# Patient Record
Sex: Female | Born: 1970 | Race: White | Hispanic: No | Marital: Single | Smoking: Current every day smoker
Health system: Southern US, Community
[De-identification: ages and names within clinical notes are randomized; demographics above are authoritative.]

## PROBLEM LIST (undated history)

## (undated) DIAGNOSIS — Z9889 Other specified postprocedural states: Secondary | ICD-10-CM

## (undated) DIAGNOSIS — F329 Major depressive disorder, single episode, unspecified: Secondary | ICD-10-CM

## (undated) DIAGNOSIS — F1120 Opioid dependence, uncomplicated: Secondary | ICD-10-CM

## (undated) DIAGNOSIS — G894 Chronic pain syndrome: Secondary | ICD-10-CM

## (undated) DIAGNOSIS — K589 Irritable bowel syndrome without diarrhea: Secondary | ICD-10-CM

## (undated) DIAGNOSIS — F32A Depression, unspecified: Secondary | ICD-10-CM

## (undated) DIAGNOSIS — K759 Inflammatory liver disease, unspecified: Secondary | ICD-10-CM

## (undated) DIAGNOSIS — K219 Gastro-esophageal reflux disease without esophagitis: Secondary | ICD-10-CM

## (undated) DIAGNOSIS — J45909 Unspecified asthma, uncomplicated: Secondary | ICD-10-CM

## (undated) DIAGNOSIS — F209 Schizophrenia, unspecified: Secondary | ICD-10-CM

## (undated) DIAGNOSIS — F1410 Cocaine abuse, uncomplicated: Secondary | ICD-10-CM

## (undated) DIAGNOSIS — K222 Esophageal obstruction: Secondary | ICD-10-CM

## (undated) DIAGNOSIS — F419 Anxiety disorder, unspecified: Secondary | ICD-10-CM

## (undated) HISTORY — DX: Gastro-esophageal reflux disease without esophagitis: K21.9

## (undated) HISTORY — DX: Depression, unspecified: F32.A

## (undated) HISTORY — DX: Schizophrenia, unspecified: F20.9

## (undated) HISTORY — PX: TOTAL ABDOMINAL HYSTERECTOMY: SHX209

## (undated) HISTORY — PX: JOINT REPLACEMENT: SHX530

## (undated) HISTORY — DX: Irritable bowel syndrome, unspecified: K58.9

## (undated) HISTORY — DX: Esophageal obstruction: K22.2

## (undated) HISTORY — DX: Anxiety disorder, unspecified: F41.9

## (undated) HISTORY — DX: Other specified postprocedural states: Z98.890

## (undated) HISTORY — DX: Major depressive disorder, single episode, unspecified: F32.9

---

## 2002-05-09 ENCOUNTER — Encounter: Payer: Self-pay | Admitting: *Deleted

## 2002-05-09 ENCOUNTER — Emergency Department (HOSPITAL_COMMUNITY): Admission: EM | Admit: 2002-05-09 | Discharge: 2002-05-09 | Payer: Self-pay | Admitting: *Deleted

## 2002-05-14 ENCOUNTER — Emergency Department (HOSPITAL_COMMUNITY): Admission: EM | Admit: 2002-05-14 | Discharge: 2002-05-14 | Payer: Self-pay | Admitting: *Deleted

## 2002-07-20 ENCOUNTER — Emergency Department (HOSPITAL_COMMUNITY): Admission: EM | Admit: 2002-07-20 | Discharge: 2002-07-20 | Payer: Self-pay | Admitting: Emergency Medicine

## 2002-07-23 ENCOUNTER — Emergency Department (HOSPITAL_COMMUNITY): Admission: EM | Admit: 2002-07-23 | Discharge: 2002-07-23 | Payer: Self-pay | Admitting: Emergency Medicine

## 2002-07-27 ENCOUNTER — Emergency Department (HOSPITAL_COMMUNITY): Admission: EM | Admit: 2002-07-27 | Discharge: 2002-07-27 | Payer: Self-pay | Admitting: *Deleted

## 2002-07-30 ENCOUNTER — Inpatient Hospital Stay (HOSPITAL_COMMUNITY): Admission: EM | Admit: 2002-07-30 | Discharge: 2002-08-07 | Payer: Self-pay | Admitting: Psychiatry

## 2002-08-17 ENCOUNTER — Emergency Department (HOSPITAL_COMMUNITY): Admission: EM | Admit: 2002-08-17 | Discharge: 2002-08-17 | Payer: Self-pay | Admitting: Emergency Medicine

## 2003-01-28 ENCOUNTER — Inpatient Hospital Stay (HOSPITAL_COMMUNITY): Admission: AD | Admit: 2003-01-28 | Discharge: 2003-01-30 | Payer: Self-pay | Admitting: Cardiology

## 2003-01-29 ENCOUNTER — Encounter: Payer: Self-pay | Admitting: Cardiology

## 2003-09-06 ENCOUNTER — Emergency Department (HOSPITAL_COMMUNITY): Admission: EM | Admit: 2003-09-06 | Discharge: 2003-09-06 | Payer: Self-pay | Admitting: Emergency Medicine

## 2003-09-06 ENCOUNTER — Encounter: Payer: Self-pay | Admitting: Emergency Medicine

## 2004-01-07 ENCOUNTER — Emergency Department (HOSPITAL_COMMUNITY): Admission: EM | Admit: 2004-01-07 | Discharge: 2004-01-07 | Payer: Self-pay | Admitting: Emergency Medicine

## 2005-04-05 ENCOUNTER — Emergency Department (HOSPITAL_COMMUNITY): Admission: EM | Admit: 2005-04-05 | Discharge: 2005-04-06 | Payer: Self-pay | Admitting: Emergency Medicine

## 2007-04-19 ENCOUNTER — Ambulatory Visit: Payer: Self-pay | Admitting: Cardiology

## 2007-04-24 ENCOUNTER — Ambulatory Visit: Payer: Self-pay | Admitting: Cardiology

## 2007-04-24 ENCOUNTER — Inpatient Hospital Stay (HOSPITAL_BASED_OUTPATIENT_CLINIC_OR_DEPARTMENT_OTHER): Admission: RE | Admit: 2007-04-24 | Discharge: 2007-04-24 | Payer: Self-pay | Admitting: Internal Medicine

## 2008-03-21 ENCOUNTER — Emergency Department (HOSPITAL_COMMUNITY): Admission: EM | Admit: 2008-03-21 | Discharge: 2008-03-21 | Payer: Self-pay | Admitting: Emergency Medicine

## 2010-01-31 ENCOUNTER — Emergency Department (HOSPITAL_COMMUNITY): Admission: EM | Admit: 2010-01-31 | Discharge: 2010-01-31 | Payer: Self-pay | Admitting: Emergency Medicine

## 2010-02-15 ENCOUNTER — Ambulatory Visit: Payer: Self-pay | Admitting: Internal Medicine

## 2010-02-15 DIAGNOSIS — F319 Bipolar disorder, unspecified: Secondary | ICD-10-CM

## 2010-02-15 DIAGNOSIS — F209 Schizophrenia, unspecified: Secondary | ICD-10-CM | POA: Insufficient documentation

## 2010-02-15 DIAGNOSIS — F341 Dysthymic disorder: Secondary | ICD-10-CM | POA: Insufficient documentation

## 2010-02-15 DIAGNOSIS — G43909 Migraine, unspecified, not intractable, without status migrainosus: Secondary | ICD-10-CM | POA: Insufficient documentation

## 2010-02-17 ENCOUNTER — Encounter: Payer: Self-pay | Admitting: Internal Medicine

## 2010-02-18 DIAGNOSIS — Z9889 Other specified postprocedural states: Secondary | ICD-10-CM

## 2010-02-18 HISTORY — DX: Other specified postprocedural states: Z98.890

## 2010-02-21 DIAGNOSIS — K219 Gastro-esophageal reflux disease without esophagitis: Secondary | ICD-10-CM

## 2010-02-21 DIAGNOSIS — R1310 Dysphagia, unspecified: Secondary | ICD-10-CM | POA: Insufficient documentation

## 2010-02-21 DIAGNOSIS — K921 Melena: Secondary | ICD-10-CM | POA: Insufficient documentation

## 2010-02-21 DIAGNOSIS — R197 Diarrhea, unspecified: Secondary | ICD-10-CM | POA: Insufficient documentation

## 2010-03-01 ENCOUNTER — Encounter: Payer: Self-pay | Admitting: Internal Medicine

## 2010-03-01 ENCOUNTER — Encounter (INDEPENDENT_AMBULATORY_CARE_PROVIDER_SITE_OTHER): Payer: Self-pay | Admitting: *Deleted

## 2010-03-03 ENCOUNTER — Ambulatory Visit (HOSPITAL_COMMUNITY): Admission: RE | Admit: 2010-03-03 | Discharge: 2010-03-03 | Payer: Self-pay | Admitting: Internal Medicine

## 2010-03-03 ENCOUNTER — Ambulatory Visit: Payer: Self-pay | Admitting: Internal Medicine

## 2010-03-06 ENCOUNTER — Encounter: Payer: Self-pay | Admitting: Internal Medicine

## 2010-03-16 ENCOUNTER — Telehealth (INDEPENDENT_AMBULATORY_CARE_PROVIDER_SITE_OTHER): Payer: Self-pay

## 2010-03-17 ENCOUNTER — Encounter: Payer: Self-pay | Admitting: Gastroenterology

## 2010-03-22 ENCOUNTER — Telehealth (INDEPENDENT_AMBULATORY_CARE_PROVIDER_SITE_OTHER): Payer: Self-pay

## 2010-03-28 ENCOUNTER — Ambulatory Visit: Payer: Self-pay | Admitting: Internal Medicine

## 2010-03-28 ENCOUNTER — Ambulatory Visit (HOSPITAL_COMMUNITY): Admission: RE | Admit: 2010-03-28 | Discharge: 2010-03-28 | Payer: Self-pay | Admitting: Internal Medicine

## 2010-03-28 DIAGNOSIS — R109 Unspecified abdominal pain: Secondary | ICD-10-CM | POA: Insufficient documentation

## 2010-03-28 DIAGNOSIS — K589 Irritable bowel syndrome without diarrhea: Secondary | ICD-10-CM

## 2010-04-13 ENCOUNTER — Encounter: Payer: Self-pay | Admitting: Internal Medicine

## 2010-04-27 ENCOUNTER — Encounter: Payer: Self-pay | Admitting: Gastroenterology

## 2010-05-16 ENCOUNTER — Emergency Department (HOSPITAL_COMMUNITY): Admission: EM | Admit: 2010-05-16 | Discharge: 2010-05-16 | Payer: Self-pay | Admitting: Emergency Medicine

## 2010-05-25 ENCOUNTER — Ambulatory Visit (HOSPITAL_COMMUNITY): Admission: RE | Admit: 2010-05-25 | Discharge: 2010-05-25 | Payer: Self-pay | Admitting: General Surgery

## 2010-06-19 ENCOUNTER — Ambulatory Visit: Payer: Self-pay | Admitting: Cardiology

## 2010-07-14 ENCOUNTER — Emergency Department (HOSPITAL_COMMUNITY): Admission: EM | Admit: 2010-07-14 | Discharge: 2010-07-14 | Payer: Self-pay | Admitting: Emergency Medicine

## 2010-08-10 ENCOUNTER — Telehealth (INDEPENDENT_AMBULATORY_CARE_PROVIDER_SITE_OTHER): Payer: Self-pay

## 2010-09-13 ENCOUNTER — Emergency Department (HOSPITAL_COMMUNITY): Admission: EM | Admit: 2010-09-13 | Discharge: 2010-09-13 | Payer: Self-pay | Admitting: Emergency Medicine

## 2010-11-01 ENCOUNTER — Emergency Department (HOSPITAL_COMMUNITY)
Admission: EM | Admit: 2010-11-01 | Discharge: 2010-11-01 | Payer: Self-pay | Source: Home / Self Care | Admitting: Emergency Medicine

## 2010-11-15 ENCOUNTER — Emergency Department (HOSPITAL_COMMUNITY)
Admission: EM | Admit: 2010-11-15 | Discharge: 2010-11-15 | Payer: Self-pay | Source: Home / Self Care | Admitting: Emergency Medicine

## 2010-11-22 ENCOUNTER — Encounter: Payer: Self-pay | Admitting: Internal Medicine

## 2010-11-24 ENCOUNTER — Emergency Department (HOSPITAL_COMMUNITY)
Admission: EM | Admit: 2010-11-24 | Discharge: 2010-11-25 | Payer: Self-pay | Source: Home / Self Care | Admitting: Emergency Medicine

## 2010-11-25 LAB — POCT I-STAT, CHEM 8
BUN: 8 mg/dL (ref 6–23)
Calcium, Ion: 1.11 mmol/L — ABNORMAL LOW (ref 1.12–1.32)
Chloride: 108 mEq/L (ref 96–112)
Creatinine, Ser: 0.9 mg/dL (ref 0.4–1.2)
Glucose, Bld: 83 mg/dL (ref 70–99)
HCT: 41 % (ref 36.0–46.0)
Hemoglobin: 13.9 g/dL (ref 12.0–15.0)
Potassium: 3.5 mEq/L (ref 3.5–5.1)
Sodium: 140 mEq/L (ref 135–145)
TCO2: 23 mmol/L (ref 0–100)

## 2010-11-29 ENCOUNTER — Encounter: Payer: Self-pay | Admitting: Internal Medicine

## 2010-11-29 ENCOUNTER — Ambulatory Visit
Admission: RE | Admit: 2010-11-29 | Discharge: 2010-11-29 | Payer: Self-pay | Source: Home / Self Care | Attending: Gastroenterology | Admitting: Gastroenterology

## 2010-11-30 ENCOUNTER — Encounter: Payer: Self-pay | Admitting: Internal Medicine

## 2010-12-05 ENCOUNTER — Ambulatory Visit (HOSPITAL_COMMUNITY)
Admission: RE | Admit: 2010-12-05 | Discharge: 2010-12-05 | Payer: Self-pay | Source: Home / Self Care | Attending: Internal Medicine | Admitting: Internal Medicine

## 2010-12-20 NOTE — Letter (Signed)
Summary: Patient Notice, Colon Biopsy Results  [facility]  [address]   [place], [place] [address]   Phone: [phone]  Fax: [phone]       March 06, 2010   ALLAN BACIGALUPI [address] [place] [address] [place], [place]  [address] 1971/05/27    Dear Ms. Parco,  I am pleased to inform you that the biopsies taken during your recent colonoscopy did not show any evidence of cancer upon pathologic examination.  Additional information/recommendations:  Continue with the treatment plan as outlined on the day of your exam.  You should have a repeat colonoscopy examination  in 10 years.  Please call us if you are having persistent problems or have questions about your condition that have not been fully answered at this time.  Sincerely,    R. Roetta Sessions MD, FACP [facility] Ph: [phone]    Fax: [phone]   Appended Document: Patient Notice, Colon Biopsy Results Letter mailed to pt.  Appended Document: Patient Notice, Colon Biopsy Results reminder in computer

## 2010-12-20 NOTE — Letter (Signed)
Summary: RECORDS FROM [facility]  RECORDS FROM [facility]   Imported By: Diana Eves 02/17/2010 14:07:28  _____________________________________________________________________  External Attachment:    Type:   Image     Comment:   External Document

## 2010-12-20 NOTE — Letter (Signed)
Summary: CT SCAN ORDER  CT SCAN ORDER   Imported By: Ave Filter 03/28/2010 12:31:24  _____________________________________________________________________  External Attachment:    Type:   Image     Comment:   External Document

## 2010-12-20 NOTE — Letter (Signed)
Summary: TCS/EGD POSS ED ORDER  TCS/EGD POSS ED ORDER   Imported By: Ave Filter 02/15/2010 14:52:33  _____________________________________________________________________  External Attachment:    Type:   Image     Comment:   External Document

## 2010-12-20 NOTE — Letter (Signed)
Summary: Out of Work Note  [facility]  [address]   [place], [place] [address]   Phone: [phone]  Fax: [phone]    03/01/2010  TO: Leodis Sias IT MAY CONCERN  RE: [name] [address],[id number] 11/19/1971       The above named individual is currently under my care and will be required to have a operatory procedure performed on Thursday April 14th, 2011.  She will be under sedation and require recovery time for the remainder of the day.    FROM: 03/03/2010   THROUGH: 03/03/2010    REASON: Multi-gastrointestinal procedure performed under anesthesia.    MAY RETURN ON: March 04, 2010     If you have any further questions or need additional information, please call.     Sincerely,     R. Roetta Sessions, MD    [facility] Phone: [phone]    Fax: [phone]

## 2010-12-20 NOTE — Medication Information (Signed)
Summary: oxycodone rx  oxycodone rx   Imported By: Hendricks Limes LPN 29/56/2130 86:57:84  _____________________________________________________________________  External Attachment:    Type:   Image     Comment:   External Document

## 2010-12-20 NOTE — Progress Notes (Signed)
Summary: pain rx request  Phone Note Call from Patient Call back at Home Phone [phone]   Caller: Patient Summary of Call: pt called- left voicemail- she has appt for ov with KJ on Monday. Wants to know if she can have something for pain to last untill Monday. pt uses [facility]. please advise. Initial call taken by: Hendricks Limes LPN,  August 10, 2010 3:17 PM     Appended Document: pain rx request can do clindium 2.5 mg (disp#40) - sig one by mouth before meals and at bedtime / no refills  Appended Document: pain rx request Pt informed. Rx called to [facility] at [place].

## 2010-12-20 NOTE — Assessment & Plan Note (Signed)
Summary: fu w/extender in next week per RMR/ss   Primary Care Provider:  None  Chief Complaint:  abd pain.  History of Present Illness: Here for work-in post-procedures, still w/ abd pain.  c/o mid-abd pain, feels like "cramping" pain, quivering, constant, 10/10 at worst, stayed in bed 2-3 days last week.  Librax helping w/ diarrhea/IBS, but not pain.  BM several per day, no bleeding.  Denies vomiting or fever.  Percocet helps best per pt, but has not had any since 2 weeks ago.  Has been on percocet previously for migraines and chronic left knee pain.  Denies any hx narcotic dependance.  Uses [facility] in [place].  4/12 H/H normal, CMP normal.  Recent EGD/colonoscopy benign without explanation of pain.   Current Problems (verified): 1)  Diarrhea  (ICD-787.91) 2)  Dysphagia Unspecified  (ICD-787.20) 3)  Gerd  (ICD-530.81) 4)  Hematochezia  (ICD-578.1) 5)  Schizophrenia  (ICD-295.90) 6)  Migraine Headache  (ICD-346.90) 7)  Depression/anxiety  (ICD-300.4) 8)  Bipolar Disorder Unspecified  (ICD-296.80)  Current Medications (verified): 1)  Lithium Carbonate 150 Mg Caps (Lithium Carbonate) .... Once Daily 2)  Seroquel 300 Mg Tabs (Quetiapine Fumarate) .... 3 At Bedtime 3)  Alprazolam 0.5 Mg Tabs (Alprazolam) .... Once Daily 4)  Oxycodone-Acetaminophen 5-325 Mg Tabs (Oxycodone-Acetaminophen) .Marland Kitchen.. 1 Po Q 4-6 Hours As Needed Abdominal Pain 5)  Ritalin 10 Mg Tabs (Methylphenidate Hcl) .... Once Daily 6)  Clidinium-Chlordiazepoxide 2.5-5 Mg Caps (Clidinium-Chlordiazepoxide) .... One By Mouth Qach and At Bedtime  Allergies (verified): 1)  ! Darvocet 2)  ! Ibuprofen  Review of Systems      See HPI General:  Complains of fatigue; denies fever, chills, sweats, anorexia, weakness, malaise, weight loss, and sleep disorder. CV:  Denies chest pains, angina, palpitations, syncope, dyspnea on exertion, orthopnea, PND, peripheral edema, and claudication. Resp:  Denies dyspnea at rest, dyspnea with  exercise, cough, sputum, wheezing, coughing up blood, and pleurisy. GI:  See HPI. GU:  Denies urinary burning, blood in urine, nocturnal urination, urinary frequency, and urinary incontinence. Derm:  Denies rash, itching, dry skin, hives, moles, warts, and unhealing ulcers.  Vital Signs:  Patient profile:   40 year old female Height:      66 inches Weight:      202 pounds BMI:     32.72 Temp:     98.1 degrees F oral Pulse rate:   84 / minute BP sitting:   110 / 84  (left arm) Cuff size:   large  Vitals Entered By: Hendricks Limes LPN (Mar 29, 2951 11:25 AM)  Physical Exam  General:  Well developed, well nourished, no acute distress. Head:  Normocephalic and atraumatic. Eyes:  Sclera clear  no icterus. Ears:  Normal auditory acuity. Mouth:  No deformity or lesions, dentition normal. Neck:  Supple; no masses or thyromegaly. Heart:  Regular rate and rhythm; no murmurs, rubs,  or bruits. Abdomen:  normal bowel sounds, obese, LLQ tenderness, epigastric tenderness, with guarding, and with rebound.   Msk:  Symmetrical with no gross deformities. Normal posture. Pulses:  Normal pulses noted. Extremities:  No clubbing, cyanosis, edema or deformities noted.  Multiple tattoos Neurologic:  Alert and  oriented x4;  grossly normal neurologically. Skin:  Intact without significant lesions or rashes. Cervical Nodes:  No significant cervical adenopathy. Psych:  Alert and cooperative. Normal mood and affect.  Impression & Recommendations:  Problem # 1:  ABDOMINAL PAIN (ICD-789.00) 40 y/o caucasian female w/ chronic severe abd pain, not relieved  by defecation and inconsistent w/ objective findings.  I suspect she has IBS, but ordered CT abd/pelvis to be sure we were not missing any acute abd pathology, including AAA, pancreatitis, appendicitis.  CT reviewed and Nothing on CT to explain pain at this point.  Orders: Est. Patient Level III (66063)  Problem # 2:  IBS (ICD-564.1) See #1  Problem #  3:  GERD (ICD-530.81) Chronic improved w/ PPI  Patient Instructions: 1)  She will not need narcotics in the future as treament for IBS. 2)  Suggest supportive measures, including fiber, probiotics, etc. 3)  Fu w/ PCP 4)  She should be on two times a day protonix 40mg  for acid reflux. Prescriptions: OXYCODONE-ACETAMINOPHEN 5-325 MG TABS (OXYCODONE-ACETAMINOPHEN) 1 po q 4-6 hours as needed abdominal pain  #20 x 0   Entered and Authorized by:   Joselyn Arrow FNP-BC   Signed by:   Joselyn Arrow FNP-BC on 03/28/2010   Method used:   Print then Give to Patient   RxID:   [id number]

## 2010-12-20 NOTE — Medication Information (Signed)
Summary: Tax adviser   Imported By: Diana Eves 03/17/2010 09:10:42  _____________________________________________________________________  External Attachment:    Type:   Image     Comment:   External Document  Appended Document: RX Folder-generic librax    Prescriptions: CLIDINIUM-CHLORDIAZEPOXIDE 2.5-5 MG CAPS (CLIDINIUM-CHLORDIAZEPOXIDE) one by mouth qach and at bedtime  #60 x 0   Entered and Authorized by:   Leanna Battles. Dixon Boos   Signed by:   Leanna Battles Wesson Stith PA-C on 03/17/2010   Method used:   Electronically to        [facility] (retail)       [address]       [place], [place]  [address]       Ph: [phone]       Fax: [phone]   RxID:   [id number]

## 2010-12-20 NOTE — Letter (Signed)
Summary: External Other  External Other   Imported By: Peggyann Shoals 04/13/2010 09:50:48  _____________________________________________________________________  External Attachment:    Type:   Image     Comment:   External Document

## 2010-12-20 NOTE — Progress Notes (Signed)
Summary: diarrhea and pain  Phone Note Call from Patient Call back at Home Phone [phone]   Caller: Patient Summary of Call: pt called- left voicemail- wants to know if RMR would call in something for diarrhea and pain to [facility]. please advise Initial call taken by: Hendricks Limes LPN,  March 16, 2010 2:59 PM     Appended Document: diarrhea and pain lets try librax one tab q ac an at bedtime (limit 4 daily);  disp 60 w 3 refills - i want to avoid narcotics.  needs ov w extender wn next 2- 3months  Appended Document: diarrhea and pain pt aware, rx called [facility]

## 2010-12-20 NOTE — Medication Information (Signed)
Summary: Tax adviser   Imported By: Diana Eves 04/27/2010 10:17:24  _____________________________________________________________________  External Attachment:    Type:   Image     Comment:   External Document  Appended Document: RX Folder RX and 3 refills given on 04/13/10  Appended Document: RX Folder Informed Molly Maduro, pharmacist, @ [facility].

## 2010-12-20 NOTE — Assessment & Plan Note (Signed)
Summary: RECTAL BLEED/SS   Visit Type:  Initial Visit Primary Care Provider:  none  Chief Complaint:  rectal bleeding x 1 month and diarrhea.  History of Present Illness: Pleasant 40 year old lady referred by Dr. Effie Shy in the  ED  to further evaluate a six-month history of bloody diarrhea and abdominal cramps. Symptoms developed insidiously some 6 months ago. Her premorbid bowel status amounted  to one to 2 bowel movements today without any blood per rectum She's never had any long-term diarrhea previously. She has multiple liquid stools daily, sometimes bloody. daily and she reports no nocturnal diarrhea. She has diffuse intermittent abdominal cramps. No recent antibiotic therapy.  She denies nonsteroidal agents. Family history significant in that her mother was diagnosed with colon cancer in her early 91s. This nice lady has never had  her lower GI tract evaluated. She has also been to multiple emergency departments  recently. She has not had a CAT scan. From January 23, 2010 her chem 20 was completely normal as was her CBC;  she had a urinalysis which was negative except for elevated specific gravity 1.030 -  abdominal series on March 16 at 11 demonstrated no acute abnormalities  She also describes heartburn daily for many, many years.  She also describes intermittent esophageal dysphagia to solids. She's never been on any prescribed medications for this problem as well. She does smoke;  she does not consume alcohol or use illicit drugs.    Preventive Screening-Counseling & Management  Alcohol-Tobacco     Smoking Status: current  Current Medications (verified): 1)  Lithium Carbonate 150 Mg Caps (Lithium Carbonate) .... Once Daily 2)  Seroquel 300 Mg Tabs (Quetiapine Fumarate) .... Three Times A Day 3)  Alprazolam 0.5 Mg Tabs (Alprazolam) .... Once Daily 4)  Oxycodone-Acetaminophen 5-325 Mg Tabs (Oxycodone-Acetaminophen) .... Q 4-6 Hours As Needed 5)  Ritalin 10 Mg Tabs (Methylphenidate Hcl)  .... Once Daily  Allergies (verified): 1)  ! Darvocet 2)  ! Ibuprofen  Past History:  Family History: Last updated: 2010/03/04 Father: deceased- lung cancer Mother: alive- colon cancer- [name] Siblings: 4 brothers, 1 sister deceased Family History of Colon Cancer: mother  Social History: Last updated: March 04, 2010 Marital Status: no Children: 2 Occupation: no Patient currently smokes.  Alcohol Use - no  Risk Factors: Smoking Status: current (03-04-2010)  Past Medical History: Anxiety Disorder  Past Surgical History: Hysterectomy  Family History: Father: deceased- lung cancer Mother: alive- colon cancer- [name] Siblings: 4 brothers, 1 sister deceased Family History of Colon Cancer: mother  Social History: Marital Status: no Children: 2 Occupation: no Patient currently smokes.  Alcohol Use - no Smoking Status:  current  Vital Signs:  Patient profile:   40 year old female Height:      66 inches Weight:      209 pounds BMI:     33.86 Temp:     98.2 degrees F oral Pulse rate:   72 / minute BP sitting:   108 / 76  (right arm) Cuff size:   regular  Vitals Entered By: Hendricks Limes LPN (Mar 04, 2010 2:03 PM)  Physical Exam  General:  a pleasant somewhat anxious lady resting comfortably in no acute distress Eyes:  no scleral icterus bruit conjunctiva are pink Lungs:  clear to auscultation Heart:  regular rate and rhythm without murmur gallop or Abdomen:  nondistended positive bowel sounds soft she has mild diffuse tenderness to palpation no appreciable mass or organomegaly Rectal:  deferred until time of  colonoscopy  Impression & Recommendations: Impression: A 40 year old lady with a 6 month history of bloody diarrhea and abdominal cramps. She has had lifelong symptoms of GERD and also describes esophageal dysphagia to solids. I agree with Dr. Effie Shy, she deserves further evaluation. By history she has frequent reflux symptoms. We need to  rule  out complicated GERD. She may have ring  or stricture or other process amenable to esophageal dilation. Further investigation warranted.  Etiology of rectal bleeding and diarrhea not clear at this point in time; she had a normal CBC earlier this month. Differential would include new-onset inflammatory bowel disease, infectious colitis less likely. Would be an  unusual presentation for occult colonic neoplasia. She does have a family history of colon cancer in a  first-degree relative.  Recommendations: EGD with esophageal dilation as appropriate as well as a diagnostic colonoscopy in the near future at the hospital.  Risks, benefits, limitations alternatives, imponderables have been reviewed. Her questions have been answered. Because of her polypharmacy, we will enlist the help of Dr. Jayme Cloud and associates with propofol sedation.  Further recommendations to follow.  Appended Document: Orders Update    Clinical Lists Changes  Problems: Added new problem of HEMATOCHEZIA (ICD-578.1) Added new problem of GERD (ICD-530.81) Added new problem of DYSPHAGIA UNSPECIFIED (ICD-787.20) Added new problem of DIARRHEA (ICD-787.91) Orders: Added new Service order of Consultation Level V [phone]) - Signed

## 2010-12-20 NOTE — Progress Notes (Signed)
Summary: phone note/ pt still having alot of abdominal pain  Phone Note Call from Patient   Caller: Patient Summary of Call: Pt called to say the Librax has helped some, her diarrhea is better but she is still having alot of abdominal pain. please advise. Initial call taken by: Cloria Spring LPN,  Mar 22, 6961 1:19 PM     Appended Document: phone note/ pt still having alot of abdominal pain sounds like she needs further evaluaton; lets ge her in wn the next week to see extender  Appended Document: phone note/ pt still having alot of abdominal pain Pt informed.  Appended Document: phone note/ pt still having alot of abdominal pain Disc w/ susan.  use urgent slot  Appended Document: phone note/ pt still having alot of abdominal pain LMOM that pt needed to be seen next week and her appt is with KJ on monday 5/9 at 1130.

## 2010-12-22 NOTE — Assessment & Plan Note (Signed)
Summary: ER F/U DIARRHEA,ABD CRAMPS,BRPR/LAW   Visit Type:  Follow-up Consult Referring Provider:  Greta Doom, PA: [facility] Primary Care Provider:  None  CC:  ER follow up- diarrhea, abd cramps, and brbpr.  History of Present Illness: Ms. Widener presents today in ED follow-up. Has hx of chronic abdominal pain, severe reflux with last EGD/TCS done April 2011. Hx of IBS. Last seen in May 2011, underwent CT which was normal, secondary to abdominal pain. Reports left-sided abdominal pain/mid abdominal pain, constant underlying ache, as well as intermittent episodes of sharp pain of 10/10, like labor pains. Not associated with stools, not associated with eating/drinking. The only thing that helps is pain medication. Lasts hours at a time. reports continued reflux, taking protonix twice/day. reports continued dysphagia, states has to "watch everything I eat or I get choked". rare nausea. unable to report # of stools per day, states "loses count". +postprandial diarrhea. liquid, watery, +brb on tissue, reports small amount of blood in toilet as well. 3-4X/week. No supplemental fiber, No probiotic. In ED, physical exam noted external hemorrhoids, not inflamed. guaic negative. Hgb 13.9, Hct 41  03/03/10: EGD/COLON IMPRESSION: 1. Circumferential distal esophageal erosions with a patulous EG     junction and Schatzki's ring consistent with moderately severe     erosive reflux esophagitis status post dilation with Maloney     dilator, small hiatal hernia otherwise normal stomach, D1 and D2. 2. Colonoscopy findings, single diminutive rectal polyp status post     cold biopsy removal, otherwise normal rectum. 3. Normal colon, normal terminal ileum.  Current Medications (verified): 1)  Lithium Carbonate 150 Mg Caps (Lithium Carbonate) .... Once Daily 2)  Seroquel 300 Mg Tabs (Quetiapine Fumarate) .... 3 At Bedtime 3)  Alprazolam 0.5 Mg Tabs (Alprazolam) .... Once Daily 4)  Oxycodone-Acetaminophen 5-325 Mg Tabs  (Oxycodone-Acetaminophen) .Marland Kitchen.. 1 Po Q 4-6 Hours As Needed Abdominal Pain 5)  Clidinium-Chlordiazepoxide 2.5-5 Mg Caps (Clidinium-Chlordiazepoxide) .... One By Mouth Qach and At Bedtime  Allergies (verified): 1)  ! Darvocet 2)  ! Ibuprofen  Past History:  Past Surgical History: Last updated: 03/01/2010 Hysterectomy  Family History: Last updated: March 01, 2010 Father: deceased- lung cancer Mother: alive- colon cancer- [name] Siblings: 4 brothers, 1 sister deceased Family History of Colon Cancer: mother  Social History: Last updated: 03-01-2010 Marital Status: no Children: 2 Occupation: no Patient currently smokes.  Alcohol Use - no  Past Medical History: Anxiety Disorder 03/03/10 EGD/Colon IMPRESSION: 1. Circumferential distal esophageal erosions with a patulous EG     junction and Schatzki's ring consistent with moderately severe     erosive reflux esophagitis status post dilation with Maloney     dilator, small hiatal hernia otherwise normal stomach, D1 and D2. 2. Colonoscopy findings, single diminutive rectal polyp status post     cold biopsy removal, otherwise normal rectum. 3. Normal colon, normal terminal ileum.  Review of Systems General:  Denies fever, chills, and anorexia. Eyes:  Denies blurring, irritation, and discharge. ENT:  Complains of difficulty swallowing; denies sore throat and hoarseness. CV:  Denies chest pains and syncope. Resp:  Denies dyspnea at rest and wheezing. GI:  Complains of difficulty swallowing, indigestion/heartburn, abdominal pain, and diarrhea; denies bloody BM's and black BMs. GU:  Denies urinary burning and urinary frequency. MS:  Denies joint pain / LOM, joint swelling, and joint stiffness. Derm:  Denies rash, itching, and dry skin. Neuro:  Denies weakness and syncope. Psych:  Denies depression and anxiety. Endo:  Denies cold intolerance and heat intolerance.  Vital Signs:  Patient profile:   40 year old female Height:       66 inches Weight:      184 pounds BMI:     29.81 Temp:     98.4 degrees F oral Pulse rate:   68 / minute BP sitting:   128 / 92  (left arm) Cuff size:   regular  Vitals Entered By: Hendricks Limes LPN (November 29, 2010 2:09 PM)  Physical Exam  General:  Well developed, well nourished, no acute distress. Head:  Normocephalic and atraumatic. Eyes:  sclera without icterus, conjuctiva clear Mouth:  No deformity or lesions, dentition normal. Lungs:  Clear throughout to auscultation. Heart:  Regular rate and rhythm; no murmurs, rubs,  or bruits. Abdomen:  +BS, non-distended, obese. No HSM. No rebound or guarding, mildly diffusely tender, +Carnett's Msk:  Symmetrical with no gross deformities. Normal posture. Neurologic:  Alert and  oriented x4;  grossly normal neurologically.  Impression & Recommendations:  Problem # 1:  ABDOMINAL PAIN (ICD-78.28)  40 year old female with chronic abdominal pain, hx of IBS. Multiple visits to ED in past. CT in May demonstrated no etiology to chronic pain, likely component of IBS. +Carnett's sign. Will add supplemental treatment to aid in symptoms of IBS, but would benefit from referral to pain management, as there may be an underlying component of functional abdominal pain. EGD/Colon in April 2011 reassurring.  Supplemental Fiber of choice to diet (samples given) Probiotic of choice daily (samples given) Referral to pain management Consider alternative to Librax in future  Orders: Est. Patient Level II (82956)  Problem # 2:  GERD (ICD-530.81)  hx of reflux with continued symptoms. also has +dysphagia, last EGd/ED done April 2011.   Switch to Dexilant, samples given UGI/BPE to assess continued dysphagia  Orders: Est. Patient Level II (21308)  Problem # 3:  HEMATOCHEZIA (ICD-578.1) Low-volume hematochezia likely secondary to external hemorrhoids. Will trial anusol suppositories. Last colonoscopy April 2011. No need for colonoscopy at this  point.  Anusol suppositories 25 mg per rectum two times a day X 10 days Call office if continues Prescriptions: ANUSOL-HC 25 MG SUPP (HYDROCORTISONE ACETATE) one per rectum two times a day X 10 days  #20 x 0   Entered and Authorized by:   Gerrit Halls NP   Signed by:   Gerrit Halls NP on 11/29/2010   Method used:   Faxed to ...       [facility] (retail)       [address]       [place], [place]  [address]       Ph: [phone]       Fax: [phone]   RxID:   [id number]

## 2010-12-22 NOTE — Letter (Signed)
Summary: UPPER GI/BPE ORDER  UPPER GI/BPE ORDER   Imported By: Rexene Alberts 11/29/2010 15:07:15  _____________________________________________________________________  External Attachment:    Type:   Image     Comment:   External Document

## 2010-12-22 NOTE — Letter (Signed)
Summary: DISABILITY DETERMINATION  DISABILITY DETERMINATION   Imported By: Rexene Alberts 11/22/2010 16:18:43  _____________________________________________________________________  External Attachment:    Type:   Image     Comment:   External Document

## 2010-12-22 NOTE — Letter (Addendum)
Summary: PAIN CLINIC REFERRAL  PAIN CLINIC REFERRAL   Imported By: Ave Filter 11/30/2010 08:36:41  _____________________________________________________________________  External Attachment:    Type:   Image     Comment:   External Document  Appended Document: PAIN CLINIC REFERRAL Pt rescheduled her appt with Dr Gerilyn Pilgrim and then NO SHOWED for the last appt.

## 2010-12-27 ENCOUNTER — Emergency Department (HOSPITAL_COMMUNITY)
Admission: EM | Admit: 2010-12-27 | Discharge: 2010-12-27 | Disposition: A | Discharge status: Home / Self Care | Payer: Self-pay | Attending: Emergency Medicine | Admitting: Emergency Medicine

## 2010-12-27 DIAGNOSIS — R10819 Abdominal tenderness, unspecified site: Secondary | ICD-10-CM | POA: Insufficient documentation

## 2010-12-27 DIAGNOSIS — K219 Gastro-esophageal reflux disease without esophagitis: Secondary | ICD-10-CM | POA: Insufficient documentation

## 2010-12-27 DIAGNOSIS — F341 Dysthymic disorder: Secondary | ICD-10-CM | POA: Insufficient documentation

## 2010-12-27 DIAGNOSIS — F319 Bipolar disorder, unspecified: Secondary | ICD-10-CM | POA: Insufficient documentation

## 2010-12-27 DIAGNOSIS — Z79899 Other long term (current) drug therapy: Secondary | ICD-10-CM | POA: Insufficient documentation

## 2010-12-27 DIAGNOSIS — R109 Unspecified abdominal pain: Secondary | ICD-10-CM | POA: Insufficient documentation

## 2010-12-27 DIAGNOSIS — G8929 Other chronic pain: Secondary | ICD-10-CM | POA: Insufficient documentation

## 2010-12-27 DIAGNOSIS — R630 Anorexia: Secondary | ICD-10-CM | POA: Insufficient documentation

## 2011-01-30 LAB — DIFFERENTIAL
Basophils Absolute: 0.1 10*3/uL (ref 0.0–0.1)
Basophils Absolute: 0.1 10*3/uL (ref 0.0–0.1)
Basophils Relative: 1 % (ref 0–1)
Basophils Relative: 1 % (ref 0–1)
Eosinophils Absolute: 0.6 10*3/uL (ref 0.0–0.7)
Eosinophils Absolute: 0.5 10*3/uL (ref 0.0–0.7)
Eosinophils Relative: 4 % (ref 0–5)
Eosinophils Relative: 5 % (ref 0–5)
Lymphocytes Relative: 37 % (ref 12–46)
Lymphocytes Relative: 51 % — ABNORMAL HIGH (ref 12–46)
Lymphs Abs: 5.3 10*3/uL — ABNORMAL HIGH (ref 0.7–4.0)
Lymphs Abs: 5.5 10*3/uL — ABNORMAL HIGH (ref 0.7–4.0)
Monocytes Absolute: 0.8 10*3/uL (ref 0.1–1.0)
Monocytes Absolute: 0.7 10*3/uL (ref 0.1–1.0)
Monocytes Relative: 6 % (ref 3–12)
Monocytes Relative: 6 % (ref 3–12)
Neutro Abs: 7.6 10*3/uL (ref 1.7–7.7)
Neutro Abs: 4 10*3/uL (ref 1.7–7.7)
Neutrophils Relative %: 53 % (ref 43–77)
Neutrophils Relative %: 37 % — ABNORMAL LOW (ref 43–77)

## 2011-01-30 LAB — POCT CARDIAC MARKERS
CKMB, poc: <1 ng/mL — ABNORMAL LOW (ref 1.0–8.0)
CKMB, poc: <1 ng/mL — ABNORMAL LOW (ref 1.0–8.0)
Myoglobin, poc: 32.6 ng/mL (ref 12–200)
Myoglobin, poc: 38.4 ng/mL (ref 12–200)
Troponin i, poc: <0.05 ng/mL (ref 0.00–0.09)
Troponin i, poc: <0.05 ng/mL (ref 0.00–0.09)

## 2011-01-30 LAB — COMPREHENSIVE METABOLIC PANEL
ALT: 23 U/L (ref 0–35)
AST: 21 U/L (ref 0–37)
Albumin: 3.9 g/dL (ref 3.5–5.2)
Alkaline Phosphatase: 72 U/L (ref 39–117)
BUN: 10 mg/dL (ref 6–23)
CO2: 25 mEq/L (ref 19–32)
Calcium: 9.1 mg/dL (ref 8.4–10.5)
Chloride: 105 mEq/L (ref 96–112)
Creatinine, Ser: 0.89 mg/dL (ref 0.4–1.2)
GFR calc Af Amer: >60 mL/min (ref >60)
GFR calc non Af Amer: >60 mL/min (ref >60)
Glucose, Bld: 91 mg/dL (ref 70–99)
Potassium: 3.9 mEq/L (ref 3.5–5.1)
Sodium: 136 mEq/L (ref 135–145)
Total Bilirubin: 0.3 mg/dL (ref 0.3–1.2)
Total Protein: 6.6 g/dL (ref 6.0–8.3)

## 2011-01-30 LAB — CBC
HCT: 36.5 % (ref 36.0–46.0)
HCT: 36.4 % (ref 36.0–46.0)
Hemoglobin: 12.5 g/dL (ref 12.0–15.0)
Hemoglobin: 13 g/dL (ref 12.0–15.0)
MCH: 31.6 pg (ref 26.0–34.0)
MCH: 32.7 pg (ref 26.0–34.0)
MCHC: 34.2 g/dL (ref 30.0–36.0)
MCHC: 35.7 g/dL (ref 30.0–36.0)
MCV: 92.4 fL (ref 78.0–100.0)
MCV: 91.7 fL (ref 78.0–100.0)
Platelets: 276 10*3/uL (ref 150–400)
Platelets: 294 10*3/uL (ref 150–400)
RBC: 3.95 MIL/uL (ref 3.87–5.11)
RBC: 3.97 MIL/uL (ref 3.87–5.11)
RDW: 13.2 % (ref 11.5–15.5)
RDW: 13.1 % (ref 11.5–15.5)
WBC: 14.5 10*3/uL — ABNORMAL HIGH (ref 4.0–10.5)
WBC: 10.8 10*3/uL — ABNORMAL HIGH (ref 4.0–10.5)

## 2011-01-30 LAB — BASIC METABOLIC PANEL
BUN: 14 mg/dL (ref 6–23)
CO2: 27 mEq/L (ref 19–32)
Calcium: 9.9 mg/dL (ref 8.4–10.5)
Chloride: 107 mEq/L (ref 96–112)
Creatinine, Ser: 1.07 mg/dL (ref 0.4–1.2)
GFR calc Af Amer: >60 mL/min (ref >60)
GFR calc non Af Amer: 57 mL/min — ABNORMAL LOW (ref >60)
Glucose, Bld: 87 mg/dL (ref 70–99)
Potassium: 3.8 mEq/L (ref 3.5–5.1)
Sodium: 141 mEq/L (ref 135–145)

## 2011-02-02 LAB — URINALYSIS, ROUTINE W REFLEX MICROSCOPIC
Bilirubin Urine: NEGATIVE
Glucose, UA: NEGATIVE mg/dL
Hgb urine dipstick: NEGATIVE
Ketones, ur: NEGATIVE mg/dL
Nitrite: NEGATIVE
Protein, ur: NEGATIVE mg/dL
Specific Gravity, Urine: 1.025 (ref 1.005–1.030)
Urobilinogen, UA: 0.2 mg/dL (ref 0.0–1.0)
pH: 6 (ref 5.0–8.0)

## 2011-02-08 LAB — BASIC METABOLIC PANEL
BUN: 15 mg/dL (ref 6–23)
CO2: 28 mEq/L (ref 19–32)
Calcium: 9.7 mg/dL (ref 8.4–10.5)
Chloride: 106 mEq/L (ref 96–112)
Creatinine, Ser: 0.76 mg/dL (ref 0.4–1.2)
GFR calc Af Amer: >60 mL/min (ref >60)
GFR calc non Af Amer: >60 mL/min (ref >60)
Glucose, Bld: 101 mg/dL — ABNORMAL HIGH (ref 70–99)
Potassium: 4.6 mEq/L (ref 3.5–5.1)
Sodium: 139 mEq/L (ref 135–145)

## 2011-02-08 LAB — HEMOGLOBIN AND HEMATOCRIT, BLOOD
HCT: 38.3 % (ref 36.0–46.0)
Hemoglobin: 13.3 g/dL (ref 12.0–15.0)

## 2011-02-12 LAB — BASIC METABOLIC PANEL
CO2: 26 mEq/L (ref 19–32)
Calcium: 9.5 mg/dL (ref 8.4–10.5)
Creatinine, Ser: 0.83 mg/dL (ref 0.4–1.2)
GFR calc non Af Amer: >60 mL/min (ref >60)
Glucose, Bld: 75 mg/dL (ref 70–99)
Sodium: 139 mEq/L (ref 135–145)

## 2011-02-12 LAB — URINALYSIS, ROUTINE W REFLEX MICROSCOPIC
Bilirubin Urine: NEGATIVE
Hgb urine dipstick: NEGATIVE
Nitrite: NEGATIVE
Protein, ur: NEGATIVE mg/dL
Specific Gravity, Urine: 1.015 (ref 1.005–1.030)
Urobilinogen, UA: 0.2 mg/dL (ref 0.0–1.0)

## 2011-02-12 LAB — CBC
Hemoglobin: 13.5 g/dL (ref 12.0–15.0)
MCHC: 34.6 g/dL (ref 30.0–36.0)
Platelets: 304 10*3/uL (ref 150–400)
RDW: 13.2 % (ref 11.5–15.5)

## 2011-02-12 LAB — DIFFERENTIAL
Basophils Absolute: 0 10*3/uL (ref 0.0–0.1)
Basophils Relative: 0 % (ref 0–1)
Lymphocytes Relative: 32 % (ref 12–46)
Monocytes Absolute: 0.1 10*3/uL (ref 0.1–1.0)
Neutro Abs: 6.9 10*3/uL (ref 1.7–7.7)
Neutrophils Relative %: 64 % (ref 43–77)

## 2011-02-22 ENCOUNTER — Emergency Department (HOSPITAL_COMMUNITY)
Admission: EM | Admit: 2011-02-22 | Discharge: 2011-02-23 | Disposition: A | Discharge status: Home / Self Care | Payer: Self-pay | Attending: Emergency Medicine | Admitting: Emergency Medicine

## 2011-02-22 DIAGNOSIS — K5090 Crohn's disease, unspecified, without complications: Secondary | ICD-10-CM | POA: Insufficient documentation

## 2011-02-22 DIAGNOSIS — K219 Gastro-esophageal reflux disease without esophagitis: Secondary | ICD-10-CM | POA: Insufficient documentation

## 2011-02-22 DIAGNOSIS — R109 Unspecified abdominal pain: Secondary | ICD-10-CM | POA: Insufficient documentation

## 2011-02-22 DIAGNOSIS — F319 Bipolar disorder, unspecified: Secondary | ICD-10-CM | POA: Insufficient documentation

## 2011-02-22 LAB — COMPREHENSIVE METABOLIC PANEL
ALT: 28 U/L (ref 0–35)
AST: 27 U/L (ref 0–37)
Albumin: 4 g/dL (ref 3.5–5.2)
Alkaline Phosphatase: 66 U/L (ref 39–117)
BUN: 15 mg/dL (ref 6–23)
Chloride: 106 mEq/L (ref 96–112)
GFR calc Af Amer: >60 mL/min (ref >60)
Potassium: 4.1 mEq/L (ref 3.5–5.1)
Sodium: 140 mEq/L (ref 135–145)
Total Bilirubin: 0.3 mg/dL (ref 0.3–1.2)
Total Protein: 6.8 g/dL (ref 6.0–8.3)

## 2011-02-22 LAB — CBC
Hemoglobin: 13.2 g/dL (ref 12.0–15.0)
MCH: 31.5 pg (ref 26.0–34.0)
MCHC: 32.8 g/dL (ref 30.0–36.0)
RDW: 13.8 % (ref 11.5–15.5)

## 2011-02-22 LAB — DIFFERENTIAL
Basophils Absolute: 0.1 10*3/uL (ref 0.0–0.1)
Basophils Relative: 1 % (ref 0–1)
Eosinophils Relative: 7 % — ABNORMAL HIGH (ref 0–5)
Monocytes Absolute: 0.9 10*3/uL (ref 0.1–1.0)
Monocytes Relative: 9 % (ref 3–12)
Neutro Abs: 4.7 10*3/uL (ref 1.7–7.7)

## 2011-02-23 LAB — URINALYSIS, ROUTINE W REFLEX MICROSCOPIC
Glucose, UA: NEGATIVE mg/dL
Hgb urine dipstick: NEGATIVE
Ketones, ur: NEGATIVE mg/dL
Protein, ur: NEGATIVE mg/dL
Urobilinogen, UA: 0.2 mg/dL (ref 0.0–1.0)

## 2011-03-06 ENCOUNTER — Encounter: Payer: Self-pay | Admitting: Gastroenterology

## 2011-03-06 ENCOUNTER — Ambulatory Visit (INDEPENDENT_AMBULATORY_CARE_PROVIDER_SITE_OTHER): Payer: Self-pay | Admitting: Gastroenterology

## 2011-03-06 DIAGNOSIS — K921 Melena: Secondary | ICD-10-CM

## 2011-03-06 DIAGNOSIS — R197 Diarrhea, unspecified: Secondary | ICD-10-CM

## 2011-03-06 DIAGNOSIS — R109 Unspecified abdominal pain: Secondary | ICD-10-CM

## 2011-03-06 MED ORDER — DICYCLOMINE HCL 10 MG PO CAPS: 10.0000 mg | ORAL_CAPSULE | Freq: Three times a day (TID) | ORAL | Status: AC

## 2011-03-06 NOTE — Patient Instructions (Signed)
Obtain stool samples and return to our office May take imodium as needed Avoid fatty foods Trial of Bentyl before meals and at bedtime We will make a referral for you to see a primary care physician Abdominal pain: will assess stool samples first then proceed with appropriate management

## 2011-03-09 ENCOUNTER — Encounter: Payer: Self-pay | Admitting: Gastroenterology

## 2011-03-09 NOTE — Assessment & Plan Note (Signed)
Chronic abdominal pain without etiology to explain: CT in past negative, labs WNL, recent EGD/colon in April 2011 reassuring. Has hx IBS, with increased loose stools most recently. Likely functional abdominal pain, with IBS exacerbating. Have already referred for pain management, but pt is unable to afford. Will trial Bentyl, check stool studies, referral for a PCP, as pt does not have one. Likely low-volume hematochezia secondary to known hemorrhoids, irritation from stool frequency.   Trial of Bentyl, rx given Continue fiber Imodium as needed Stool samples, doubt infectious etiology, but due to increased frequency will assess Further rec's to follow

## 2011-03-09 NOTE — Progress Notes (Signed)
Referring Provider: No ref. provider found Primary Care Physician:  No primary provider on file. Gastroenterologist: Dr. Jena Gauss  Chief Complaint  Patient presents with  . Diarrhea    times 1-2 years  . GI Bleeding    when wiping it will sometimes be all blood    HPI:   [name] presents today in f/u for chronic abdominal pain, IBS, hemorrhoids. In May 2011, CT abd performed for abdominal pain which was normal. EGD/colon in April 2011: see PMH for full details. Continues to c/o lower abdominal pain, constant, feels like labor pains. BM doesn't relieve. 7-8 stools per day. Sometimes paper hematochezia. Taking fiber, no probiotic. States everything she eats and drinks goes right through her. No sick contacts, no recent hospitalization or abx.  States only thing that helps is pain medication. Was scheduled for pain management, but then rescheduled, then no-showed. States she can't afford. Doesn't have PCP.   February 22, 2011: H/H 13.2/40.3. CMP all nl.  Has had multiple ED visits. Most recent visit documents "Crohn's flares". Unsure why, as pt does not have Crohn's. Pt denies she told this to the ED.   Past Medical History  Diagnosis Date  . GERD (gastroesophageal reflux disease)   . IBS (irritable bowel syndrome)   . S/P endoscopy April 2011    erosive esophagitis, s/p Maloney dilation  . S/P colonoscopy April 2011    rectal polyp, otherwise normal  . Depression   . Schizophrenia     Past Surgical History  Procedure Date  . Total abdominal hysterectomy     Current Outpatient Prescriptions  Medication Sig Dispense Refill  . clonazePAM (KLONOPIN) 2 MG tablet Take 2 mg by mouth 2 (two) times daily as needed.        . lithium 300 MG capsule Take 300 mg by mouth at bedtime.        Marland Kitchen omeprazole (PRILOSEC) 20 MG capsule Take 20 mg by mouth daily.        . QUEtiapine (SEROQUEL) 300 MG tablet Take 900 mg by mouth at bedtime.          Allergies as of 03/06/2011 - Review Complete  03/06/2011  Allergen Reaction Noted  . Ibuprofen    . Propoxyphene n-acetaminophen      Family History  Problem Relation Age of Onset  . Colon cancer Mother     [name]    History   Social History  . Marital Status: Single    Spouse Name: N/A    Number of Children: N/A  . Years of Education: N/A   Social History Main Topics  . Smoking status: Current Everyday Smoker -- 1.0 packs/day for 26 years    Types: Cigarettes  . Smokeless tobacco: Never Used  . Alcohol Use: No  . Drug Use: No  . Sexually Active: No     Review of Systems: Gen: Denies fever, chills, anorexia. Denies fatigue, weakness, weight loss.  CV: Denies chest pain, palpitations, syncope, peripheral edema, and claudication. Resp: Denies dyspnea at rest, cough, wheezing, coughing up blood, and pleurisy. GI: Denies vomiting blood, jaundice, and fecal incontinence.   Denies dysphagia or odynophagia. Derm: Denies rash, itching, dry skin Psych: Denies depression, anxiety, memory loss, confusion. No homicidal or suicidal ideation.  Heme: Denies bruising, bleeding, and enlarged lymph nodes.  Physical Exam: BP 110/73  Pulse 85  Temp 98.6 F (37 C)  Ht 5\' 7"  (1.702 m)  Wt 182 lb 3.2 oz (82.645 kg)  BMI 28.54 kg/m2 General:  Alert and oriented. No distress noted. Anxious appearing.  Head:  Normocephalic and atraumatic. Eyes:  Conjuctiva clear without scleral icterus. Mouth:  Oral mucosa pink and moist. Good dentition. No lesions. Heart:  S1, S2 present without murmurs, rubs, or gallops. Regular rate and rhythm. Abdomen:  +BS, soft, tender to palpation lower abdomen. and non-distended. No rebound or guarding. No HSM or masses noted. Msk:  Symmetrical without gross deformities. Normal posture. Extremities:  Without edema. Neurologic:  Alert and  oriented x4;  grossly normal neurologically. Skin:  Intact without significant lesions or rashes. Cervical Nodes:  No significant cervical adenopathy. Psych:   Alert and cooperative. Anxious, fidgety, concerned appearing.

## 2011-03-09 NOTE — Assessment & Plan Note (Signed)
See "abdominal pain"

## 2011-04-04 NOTE — Cardiovascular Report (Signed)
NAMEDOROTA, HEINRICHS              ACCOUNT NO.:  [id number]   MEDICAL RECORD NO.:  [id number]          PATIENT TYPE:  OIB   LOCATION:  1963                         FACILITY:  [facility]   PHYSICIAN:  Salvadore Farber, MD  DATE OF BIRTH:  25-Sep-1971   DATE OF PROCEDURE:  04/24/2007  DATE OF DISCHARGE:                            CARDIAC CATHETERIZATION   PROCEDURE:  Left heart catheterization, left ventriculography, coronary  angiography.   INDICATIONS:  Ms. Harewood is a 40 year old lady with multiple chronic  pains who presents with complaints of chest pain.  In consultation with  Dr. Andee Lineman she insisted upon cardiac catheterization for definitive  exclusion of coronary disease as the etiology of her pain.  She presents  for that procedure today.   PROCEDURAL TECHNIQUE:  Informed consent was obtained.  Under 1%  lidocaine local anesthesia, A 4-French sheath was placed in the right  common femoral artery using a modified Seldinger technique.  Diagnostic  angiography and ventriculography were performed using JL-4, 3D-RCA, and  pigtail catheters.  The patient tolerated the procedure well and was  transferred to holding room in stable condition.  Sheath will be removed  there.   COMPLICATIONS:  None.   FINDINGS:  1. LV:  95/5/12.  EF 65% without regional wall motion abnormality.  2. No aortic stenosis or mitral regurgitation.  3. Left main:  Angiographically normal.  4. LAD:  Moderate-sized vessel giving rise to three diagonals.  It is      angiographically normal.  5. Circumflex:  Moderate-sized vessel giving rise to one large      marginal.  It is angiographically normal.  6.  RCA:  Moderate-sized      dominant vessel.  It is angiographically normal.   normal coronary arteries.  Normal left ventricular size and systolic  function.  No aortic stenosis.      Salvadore Farber, MD  Electronically Signed     WED/MEDQ  D:  04/24/2007  T:  04/24/2007  Job:  [id number]

## 2011-04-04 NOTE — Assessment & Plan Note (Signed)
[facility]                          [facility] OFFICE NOTE   LUIS, SAMI                     MRN:          [id number]  DATE:04/19/2007                            DOB:          Feb 20, 1971    REASON FOR CONSULTATION:  Evaluation of substernal chest pain.   HISTORY OF PRESENT ILLNESS:  The patient is a 40 year old female with  history of left-sided substernal chest pain. The patient states that for  the last several months she has had left-sided chest tightness which  radiates to the front of her chest and in the middle of the chest  ultimately causing a heavy sensation. She has associated shortness of  breath. She states that these pains happen all of a sudden and they are  not particularly related to exertion. She has no associated diaphoresis  or nausea. The patient also complains of chronic pain syndrome and has  been prescribed in the past by Dr. Linna Darner, Percocet. She also has poor  exercise tolerance part due to deconditioning. She denies any orthopnea  or PND. She has no palpitations. She states that at that time, she has  orthostasis symptoms but there is no frank syncope. The patient has  cardiac risk factors including, heavy tobacco use, smoking a least a  pack of cigarettes a day. The patient over the last several weeks has  been admitted twice to [facility] and has been ruled out each  time for myocardial infarction, also has a CT scan of the chest done  which was reportedly was within normal limits. No stress testing or  echocardiograph study has been done however previously. Several years  ago the patient reports that she has had a catheterization done at [facility] which I presume was negative, although we do not have the results  available. (as of this dictation our computer system was down in the  office). She also had prior exercise stress testing done.   ALLERGIES:  DARVOCET.   MEDICATIONS:  1. Seroquel 20  mg p.o. at bedtime.  2. Trazodone 100 mg 2 tablets at bedtime.  3. __________ p.o. t.i.d.  4. Bactrim DS b.i.d. times 7 days.  5. Percocet 10/325 p.r.n.   FAMILY HISTORY:  History of coronary artery disease, diabetes mellitus,  and dyslipidemia. The patient is a family of Clemmie Julian Reil and Enid Skeens, both patients of ours. The former has pulmonary atresia and the  later has severe mitral regurgitation and coronary artery disease.   SOCIAL HISTORY:  The patient smokes as outlined above. She denies any IV  drug or other drug use. She drinks occasional beer but only socially.   REVIEW OF SYSTEMS:  No fever or chills. No headache. No melena,  hematochezia. No dysuria or frequency. No joint stiffness. No definite  neurological symptoms. No polyuria or polydipsia. The remainder of the  review of systems is otherwise negative.   PHYSICAL EXAMINATION:  VITAL SIGNS: Blood pressure is 89/55, heart rate  is 79 beats per minute.  GENERAL: Overweight white female but in no apparent distress.  HEENT: Normal.  NECK EXAM: Normal carotid  upstrokes. No carotid bruits.  LUNGS: Clear breath sounds bilaterally.  HEART: Regular rate and rhythm, normal S1, S2. No murmur, rubs, or  gallops.  ABDOMEN: Soft and nontender. No rebound or guarding. Good bowel sounds.  EXTREMITY EXAM: No cyanosis, clubbing, or edema.  NEURO: Patient alert, oriented, and grossly nonfocal.   PROBLEM LIST:  1. Atypical chest pain.  2. Tobacco use.  3. History of anxiety and depression.  4. Chronic pain syndrome.  5. Family history of coronary artery disease.   PLAN:  1. The patient's symptoms of left-sided chest pain are atypical.  She      has been worked up with a CT scan previously and this has been      within normal limits.  2. The patient has prior cardiac catheterization which results I do      not have available presently. I have offered the patient to      procedure with exercise stress testing but she  would like to      proceed with a catheterization. She states that she wants to be      certain and at this point does not want to do a stress test. I      explained the risk and benefit of catheterization to the patient,      and she is willing to proceed. The patient will be scheduled in our      J[facility] catheterization lab next week.     Learta Codding, MD,FACC  Electronically Signed    GED/MedQ  DD: 04/19/2007  DT: 04/19/2007  Job #: [id number]   cc:   Erasmo Downer, MD

## 2011-04-07 NOTE — Cardiovascular Report (Signed)
[name], [name]                        ACCOUNT NO.:  [id number]   MEDICAL RECORD NO.:  [id number]                   PATIENT TYPE:  INP   LOCATION:  3702                                 FACILITY:  [facility]   PHYSICIAN:  Veneda Melter, M.D. [facility]               DATE OF BIRTH:  [birth year]   DATE OF PROCEDURE:  01/30/2003  DATE OF DISCHARGE:                              CARDIAC CATHETERIZATION   PROCEDURES PERFORMED:  1. Left heart catheterization with left ventriculogram.  2. Selective coronary angiography.   DIAGNOSES:  1. Trivial coronary artery disease by entrapment.  2. Normal left ventricular systolic function.   CARDIOLOGIST:  Veneda Melter, M.D.   HISTORY:  [name] with a history of  anxiety disorder who presented with syncope and substernal chest discomfort.  The patient was stabilized medically.  She underwent noninvasive stress  imaging study suggestive of ischemia of the anterior wall.  She is referred  for further cardiac assessment.   DESCRIPTION OF PROCEDURE:  Informed consent was obtained.  The patient was  brought to the catheterization lab.  A 6 French sheath was placed in the  right femoral artery using the modified Seldinger technique.  Six [place]  and JR4 catheters were then used to engage the left and right coronary  arteries, and selective coronary angiography performed in various  projections using manual injection of contrast.  A 6 French pigtail catheter  was then advanced in the left ventricle and left ventriculogram performed  using power injection of contrast.   At the termination of the case the catheters and sheaths were removed.  Manual pressure was applied until adequate hemostasis was achieved.   The patient tolerated the procedure well and was transferred to the floor in  stable condition.   FINDINGS:  The findings are as follows.  1. Left main trunk:  Normal caliber vessel with luminal irregularities.  2. LAD:  This is a medium caliber vessel and supplies three small diagonal     branches.  The LAD system has luminal irregularities.  3. Left circumflex artery:  Medium caliber vessel provides a large first     marginal branch, proximal segment, and a trivial second marginal branch     distally.  The left circumflex system has luminal disease.  4. Right coronary:  Dominant vessel and supplies the posterior descending     artery and a posterior ventricular branch, terminal segment.  The right     coronary artery has liminal irregularities.  5. LV:  Normal end systolic and end-diastolic dimensions.  Overall left     ventricular function is well preserved.  Ejection fraction approximately     55%.  No mitral regurgitation.  LV pressures 130/10, aortic is 130/80 and     LVEDP equals 20.    ASSESSMENT/PLAN:  The patient is a 40 year old female with noncoronary chest  pain.  Continue medical  therapy.  We will pursue another cause of chest pain  investigated.                                                 Veneda Melter, M.D. [facility]    NG/MEDQ  D:  01/30/2003  T:  01/31/2003  Job:  [id number]   cc:   Learta Codding, M.D. [facility]  [address]  [address]  [place]  [place] [address]   Erasmo Downer, M.D.
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
## 2011-04-07 NOTE — Discharge Summary (Signed)
[name], [name]                        ACCOUNT NO.:  [id number]   MEDICAL RECORD NO.:  [id number]                   PATIENT TYPE:  INP   LOCATION:  3702                                 FACILITY:  [facility]   PHYSICIAN:  Rollene Rotunda, M.D. [facility]            DATE OF BIRTH:  Sep 20, 1971   DATE OF ADMISSION:  01/28/2003  DATE OF DISCHARGE:  01/30/2003                           DISCHARGE SUMMARY - REFERRING   PROCEDURES:  1. Cardiac catheterization.  2. Coronary arteriogram.  3. Left ventriculogram.   HISTORY OF PRESENT ILLNESS:  The patient is a 40 year old female with no  known history of coronary artery disease.  She went to [facility]  after an episode of syncope and was seen by Regina Eck, M.D., who  referred her to Learta Codding, M.D.  Of note, she was hypokalemic upon  admission there with a potassium of 3.3 and stated that at times she was  drinking a 12-pack of soda every day.  She also has a history of ongoing  tobacco use.  She was orthostatic, but her symptoms were somewhat compatible  with neurocardiogenic syncope.  She had ventricular bigeminy improved with  premature ventricular contractions.   HOSPITAL COURSE:  She had some chest pain and so a Cardiolite was performed.  The nuclear data showed a large anterior wall infarct consistent with  ischemia, so she was transferred to [facility] for  further evaluation and treatment.  The cardiac catheterization showed a left  main with some luminal irregularities and an LAD, circumflex, and RCA also  with some luminal irregularities, but no significant coronary artery  disease.  There was no spasm noted.  Her EF was approximately 55% with no  MR.  It was felt that she needed a Holter monitor, but this could be  performed as a outpatient in [place], [place].  Also, she needs to  follow up with Learta Codding, M.D., in [place], [place], and with  Regina Eck, M.D.  Post  catheterization, she was ambulating without chest  pain or shortness of breath.  She had no significant arrhythmias on the  monitor and was considered stable for discharge on January 30, 2003, with  outpatient follow-up.   LABORATORY DATA:  Hemoglobin 11.8, hematocrit 33.8, WBC 8.9, platelets 199.  Sodium 141, potassium 4.4, chloride 112, CO2 28, BUN 8, creatinine 0.8,  glucose 96.  Total cholesterol 188, triglycerides 70, HDL 53, LDL 121.   Chest x-ray:  The cardiac silhouette is borderline enlarged with minimal  bronchitic changes and no acute disease.   CONDITION ON DISCHARGE:  Stable.   DISCHARGE DIAGNOSES:  1. Syncope possibly secondary to arrhythmia or secondary to neurocardiogenic     syncope.  2. Abnormal Cardiolite with minimal coronary artery disease by     catheterization and preserved left ventricular function.  3. Premature ventricular contractions.  4. History of panic attacks.  5. History of  high caffeine intake.  6. Ongoing tobacco use.  7. Allergies to Darvocet and Tylenol No. 3.  8. Status post cyst removal on her head.  9. Status post hysterectomy.   ACTIVITY:  Her activity level was to include no driving or sexual or  strenuous activity for two days.   DIET:  She is to stick to a low-fat diet.   WOUND CARE:  She is to call the office for problems with a catheterization  site.   FOLLOW-UP:  She is to follow up in the [place], [place], office with Learta Codding, M.D., and to a Holter monitor.   DISCHARGE MEDICATIONS:  1. Coated aspirin 81 mg daily.  2. Toprol XL 25 mg daily.  3. Xanax 1 mg as prior to admission.  4. Seroquel as prior to admission.     Lavella Hammock, P.A. [facility]                  Rollene Rotunda, M.D. [facility]    RG/MEDQ  D:  01/30/2003  T:  01/31/2003  Job:  [id number]   cc:   [facility]  [place], [place]   Regina Eck, M.D.

## 2011-04-18 ENCOUNTER — Encounter: Payer: Self-pay | Admitting: Gastroenterology

## 2011-04-18 NOTE — Progress Notes (Unsigned)
  Pt did not complete stool studies as requested. Let's get a PR on pt. If Bentyl is helping, continue with plan. No need for updated stool studies.

## 2011-04-19 ENCOUNTER — Encounter: Payer: Self-pay | Admitting: Gastroenterology

## 2011-04-19 ENCOUNTER — Encounter: Payer: Self-pay | Admitting: Internal Medicine

## 2011-04-19 ENCOUNTER — Ambulatory Visit (INDEPENDENT_AMBULATORY_CARE_PROVIDER_SITE_OTHER): Payer: Self-pay | Admitting: Gastroenterology

## 2011-04-19 ENCOUNTER — Other Ambulatory Visit: Payer: Self-pay | Admitting: Gastroenterology

## 2011-04-19 VITALS — BP 119/70 | HR 86 | Temp 98.6°F | Ht 66.0 in | Wt 173.2 lb

## 2011-04-19 DIAGNOSIS — R197 Diarrhea, unspecified: Secondary | ICD-10-CM

## 2011-04-19 DIAGNOSIS — R1310 Dysphagia, unspecified: Secondary | ICD-10-CM

## 2011-04-19 DIAGNOSIS — R109 Unspecified abdominal pain: Secondary | ICD-10-CM

## 2011-04-19 MED ORDER — DEXLANSOPRAZOLE 60 MG PO CPDR: 60.0000 mg | DELAYED_RELEASE_CAPSULE | Freq: Every day | ORAL | Status: AC

## 2011-04-19 NOTE — Progress Notes (Signed)
Referring Provider: No ref. provider found Primary Care Physician:  No primary provider on file. Primary Gastroenterologist: Dr. Jena Gauss  Chief Complaint  Patient presents with  . Diarrhea    HPI:   Ms. Oldaker is a 40 year old female who presents in f/u for continued diarrhea. At last visit, she was instructed to complete stool studies. Results are not available, although pt states she did complete this. She continues to report 7-8 loose stools per day. Complains of abdominal pain, constant, located mid-abdomen, and bilaterally. Denies fever or chills. Following high fiber diet. No melena or brbpr. Recent colonoscopy, see PMH.  Complains of worsening dysphagia. Hx of Schatzki's ring s/p dilation in April 2011, erosive esophagitis. Takes Prilosec daily. Rolaids prn. Worsening reflux. UGI with BPE in Jan 2012 without any impedance to pill, no dysmotility. Signs of duodenitis.   CT May 2011 nl. Was set up for pain management, no-showed, now says she can't afford.  Pt states pain medication is the only thing that helps.   Wts: Mar 2011~209 May 2011~202 Jan 2012~184 April 2012~182 May 2012~173  Past Medical History  Diagnosis Date  . GERD (gastroesophageal reflux disease)   . IBS (irritable bowel syndrome)   . S/P endoscopy April 2011    erosive esophagitis, s/p Maloney dilation  . S/P colonoscopy April 2011    rectal polyp, otherwise normal  . Depression   . Schizophrenia   . Anxiety disorder     Past Surgical History  Procedure Date  . Total abdominal hysterectomy        Current Outpatient Prescriptions  Medication Sig Dispense Refill  . ALPRAZolam (XANAX) 0.5 MG tablet Take 0.5 mg by mouth 1 dose over 24 hours.        . clidinium-chlordiazePOXIDE (LIBRAX) 2.5-5 MG per capsule Take 1 capsule by mouth 4 (four) times daily -  before meals and at bedtime.        Marland Kitchen lithium 300 MG capsule Take 150 mg by mouth at bedtime.       Marland Kitchen omeprazole (PRILOSEC) 20 MG capsule Take 20 mg  by mouth daily.        Marland Kitchen oxyCODONE-acetaminophen (PERCOCET) 5-325 MG per tablet Take 1 tablet by mouth every 6 (six) hours as needed.        Marland Kitchen QUEtiapine (SEROQUEL) 300 MG tablet Take 900 mg by mouth at bedtime.        . clonazePAM (KLONOPIN) 2 MG tablet Take 2 mg by mouth 2 (two) times daily as needed.        Marland Kitchen dexlansoprazole (DEXILANT) 60 MG capsule Take 1 capsule (60 mg total) by mouth daily.  31 capsule  3  . hydrocortisone (ANUSOL-HC) 25 MG suppository Place 25 mg rectally 2 (two) times daily.          Allergies as of 04/19/2011 - Review Complete 04/19/2011  Allergen Reaction Noted  . Ibuprofen    . Propoxyphene n-acetaminophen      Family History  Problem Relation Age of Onset  . Colon cancer Mother     [name]    History   Social History  . Marital Status: Single    Spouse Name: N/A    Number of Children: N/A  . Years of Education: N/A   Social History Main Topics  . Smoking status: Current Everyday Smoker -- 1.0 packs/day for 26 years    Types: Cigarettes  . Smokeless tobacco: Never Used  . Alcohol Use: No  . Drug Use: No    Review  of Systems: Gen: Denies fever, chills, anorexia. Denies fatigue, weakness. +WT LOSS CV: Denies chest pain, palpitations, syncope, peripheral edema, and claudication. Resp: Denies dyspnea at rest, cough, wheezing, coughing up blood, and pleurisy. GI: Denies vomiting blood, jaundice, and fecal incontinence.   +DYSHAGIA Derm: Denies rash, itching, dry skin Psych: Denies depression, anxiety, memory loss, confusion. No homicidal or suicidal ideation.  Heme: Denies bruising, bleeding, and enlarged lymph nodes.  Physical Exam: BP 119/70  Pulse 86  Temp(Src) 98.6 F (37 C) (Temporal)  Ht 5\' 6"  (1.676 m)  Wt 173 lb 3.2 oz (78.563 kg)  BMI 27.96 kg/m2 General:   Alert and oriented. No distress noted. Pleasant and cooperative.  Head:  Normocephalic and atraumatic. Eyes:  Conjuctiva clear without scleral icterus. Mouth:  Oral  mucosa pink and moist. Good dentition. No lesions. Neck:  Supple, without mass or thyromegaly. Heart:  S1, S2 present without murmurs, rubs, or gallops. Regular rate and rhythm. Abdomen:  +BS, soft, non-tender and non-distended. No rebound or guarding. No HSM or masses noted. Msk:  Symmetrical without gross deformities. Normal posture. Extremities:  Without edema. Neurologic:  Alert and  oriented x4;  grossly normal neurologically. Skin:  Intact without significant lesions or rashes. Cervical Nodes:  No significant cervical adenopathy. Psych:  Alert and cooperative. Normal mood and affect.

## 2011-04-19 NOTE — Progress Notes (Signed)
Pt still having diarrhea. I scheduled a 2:00 OV with you to day.

## 2011-04-19 NOTE — Patient Instructions (Signed)
Please complete stool studies.  Please have labs completed as well.  Stop Prilosec. Begin Dexilant daily. Samples have been provided as well as prescription.  We have set you up for an endoscopy due to severe reflux and difficulty swallowing.   We will be contacting you with further plans once stool studies are completed and labs are done.

## 2011-04-20 ENCOUNTER — Encounter: Payer: Self-pay | Admitting: Gastroenterology

## 2011-04-20 LAB — TISSUE TRANSGLUTAMINASE, IGA: Tissue Transglutaminase Ab, IgA: 3.6 U/mL (ref <20)

## 2011-04-20 NOTE — Assessment & Plan Note (Signed)
40 year old Caucasian female with multiple medical problems and polypharmacy, chronic abdominal pain, and chronic diarrhea. This has been unresponsive to Librax, Bentyl, high fiber diet, lactose-free diet. Colonoscopy performed April 2011. Essentially, labs have remained stable without signs of anemia, malnutrition, other processes. CT negative. Will need to pursue celiac panel, stool studies, and possible flex sig to r/o microscopic colitis or other etiology. Will await stool studies first. Doubt we are dealing with IBD, but remains in differential. Further recommendations after labs/stool studies completed.

## 2011-04-20 NOTE — Assessment & Plan Note (Signed)
Hx of Schatzki's ring s/p dilation in April 2011. UGI with BPE in Jan 2012 without dysmotility or pill lodging. +duodenitis. Pt with worsening dysphagia, stands up to eat, increasing/worsening reflux despite Prilosec daily. Continued wt loss of approximately 36 lbs since May 2011 is concerning. 9 lbs lost in interim from last visit, one month ago. Likely multifactorial due to dysphagia, chronic diarrhea. Please see plan under abdominal pain/diarrhea. For dysphagia, likely r/t Schatzki's ring, question of gastritis, PUD.  EGD/ED with Dr. Jena Gauss with Propofol due to polypharmacy and possible difficult sedation. The R/B/A have been discussed in detail with the pt. She states understanding. Flex sig may be done at time of procedure, pending stool study results.  Switch from Prilosec to Dexilant daily.

## 2011-04-20 NOTE — Assessment & Plan Note (Signed)
Hx of chronic abdominal pain with CT in past unremarkable for etiology. Referred to pain management, but unable to afford. Pain is described as constant, mid-abdomen and bilaterally. States only pain medication relieves. Hx of IBS, which may very well be contributing to clinical picture; believe there is a component of functional abdominal pain. On multiple meds. However, continued diarrhea despite medication management and continued wt loss is concerning. Will complete stool studies as requested (see diarrhea), check celiac panel, pursue EGD due to dysphagia, possible flex sig to assess for microscopic colitis or other etiology. This will be determined after stool studies completed. WIll need to be done in OR. See "diarrhea".

## 2011-04-20 NOTE — Progress Notes (Signed)
No PCP on file 

## 2011-04-25 ENCOUNTER — Telehealth: Payer: Self-pay | Admitting: Internal Medicine

## 2011-04-25 LAB — GIARDIA/CRYPTOSPORIDIUM (EIA): Cryptosporidium Screen (EIA): NEGATIVE

## 2011-04-25 NOTE — Telephone Encounter (Signed)
Pt called stating she was having a lot of abdominal pain and would like something called in for pain.

## 2011-04-26 ENCOUNTER — Other Ambulatory Visit: Payer: Self-pay | Admitting: Internal Medicine

## 2011-04-26 ENCOUNTER — Telehealth: Payer: Self-pay | Admitting: Internal Medicine

## 2011-04-26 DIAGNOSIS — R634 Abnormal weight loss: Secondary | ICD-10-CM

## 2011-04-26 NOTE — Telephone Encounter (Signed)
Pt called again this morning asking for pain meds- she states she has lost 3 more pounds since last visit- please advise

## 2011-04-26 NOTE — Telephone Encounter (Signed)
Addressed in different phone note.  

## 2011-04-26 NOTE — Telephone Encounter (Signed)
Called Patient and made aware for Rx called in and told her results of stool studies.

## 2011-04-26 NOTE — Telephone Encounter (Signed)
Pt is scheduled for CT on 04/28/11 @ 3:15- She is aware

## 2011-04-26 NOTE — Telephone Encounter (Signed)
Can you please schedule CT scan. Thanks

## 2011-04-26 NOTE — Telephone Encounter (Signed)
Let's do CT abd/pelvis with contrast this week if possible.  Needs creatinine I believe.   Stool studies negative except for fecal lactoferrin: positive. May ultimately need flex sig at time of EGD next week.   May call in Vicodin 5/500, 1 tab po q 6 hours prn pain, disp# 20. Will not make habit of prescribing narcotics for chronic abdominal pain; however, given circumstances of continued wt loss, diarrhea, etc, will provide this one time.

## 2011-04-27 ENCOUNTER — Ambulatory Visit: Payer: Self-pay | Admitting: Gastroenterology

## 2011-04-28 ENCOUNTER — Ambulatory Visit (HOSPITAL_COMMUNITY)
Admission: RE | Admit: 2011-04-28 | Discharge: 2011-04-28 | Disposition: A | Discharge status: Home / Self Care | Payer: Self-pay | Source: Ambulatory Visit | Attending: Internal Medicine | Admitting: Internal Medicine

## 2011-04-28 DIAGNOSIS — R634 Abnormal weight loss: Secondary | ICD-10-CM | POA: Insufficient documentation

## 2011-04-28 DIAGNOSIS — R197 Diarrhea, unspecified: Secondary | ICD-10-CM | POA: Insufficient documentation

## 2011-04-28 DIAGNOSIS — R109 Unspecified abdominal pain: Secondary | ICD-10-CM | POA: Insufficient documentation

## 2011-04-28 LAB — STOOL CULTURE

## 2011-04-28 MED ORDER — IOHEXOL 300 MG/ML  SOLN
100.0000 mL | Freq: Once | INTRAMUSCULAR | Status: AC | PRN
  Administered 2011-04-28: 100 mL via INTRAVENOUS

## 2011-04-30 ENCOUNTER — Emergency Department (HOSPITAL_COMMUNITY)
Admission: EM | Admit: 2011-04-30 | Discharge: 2011-05-01 | Disposition: A | Discharge status: Home / Self Care | Payer: Self-pay | Attending: Emergency Medicine | Admitting: Emergency Medicine

## 2011-04-30 DIAGNOSIS — F319 Bipolar disorder, unspecified: Secondary | ICD-10-CM | POA: Insufficient documentation

## 2011-04-30 DIAGNOSIS — Z9079 Acquired absence of other genital organ(s): Secondary | ICD-10-CM | POA: Insufficient documentation

## 2011-04-30 DIAGNOSIS — R109 Unspecified abdominal pain: Secondary | ICD-10-CM | POA: Insufficient documentation

## 2011-04-30 DIAGNOSIS — F411 Generalized anxiety disorder: Secondary | ICD-10-CM | POA: Insufficient documentation

## 2011-04-30 DIAGNOSIS — F17200 Nicotine dependence, unspecified, uncomplicated: Secondary | ICD-10-CM | POA: Insufficient documentation

## 2011-05-01 ENCOUNTER — Encounter (HOSPITAL_COMMUNITY): Payer: Medicaid Other | Attending: Internal Medicine

## 2011-05-01 ENCOUNTER — Ambulatory Visit (INDEPENDENT_AMBULATORY_CARE_PROVIDER_SITE_OTHER): Payer: Self-pay | Admitting: Urgent Care

## 2011-05-01 ENCOUNTER — Encounter: Payer: Self-pay | Admitting: Urgent Care

## 2011-05-01 VITALS — BP 110/73 | HR 74 | Temp 97.2°F | Ht 66.0 in | Wt 174.0 lb

## 2011-05-01 DIAGNOSIS — R109 Unspecified abdominal pain: Secondary | ICD-10-CM

## 2011-05-01 DIAGNOSIS — K921 Melena: Secondary | ICD-10-CM

## 2011-05-01 DIAGNOSIS — R1310 Dysphagia, unspecified: Secondary | ICD-10-CM

## 2011-05-01 LAB — BASIC METABOLIC PANEL
BUN: 11 mg/dL (ref 6–23)
CO2: 28 mEq/L (ref 19–32)
Chloride: 104 mEq/L (ref 96–112)
Glucose, Bld: 84 mg/dL (ref 70–99)
Potassium: 3.6 mEq/L (ref 3.5–5.1)

## 2011-05-01 LAB — HEMOGLOBIN AND HEMATOCRIT, BLOOD: HCT: 40.1 % (ref 36.0–46.0)

## 2011-05-01 NOTE — Progress Notes (Signed)
Referring Provider: Dr. Oletta Lamas [facility]) Primary Care Physician:  n/a Primary Gastroenterologist:  Dr. Jena Gauss  Chief Complaint  Patient presents with  . Follow-up    from ER(blood in stool)    HPI:  [name] is a 40 y.o. female here for follow up for ER visit for acute on chronic abd pain & hematochezia.  Pt went to [facility] ER last night for bloody diarrhea & severe abd pain.  C/o pain bilat upper quadrants that is constant 10/10 now.  Says she has not had any pain meds filled but has rx.  Couldn't go to pain clinic previously as she said that she could not afford to go.  Denies nasuea or vomiting.  BM TNTC from 5-7am  #7 times 1st thing in the AM & immediately after each time she eats.  C/o dysphagia w/ both solids/liquids.  Was taking BC powders, quit them recently.  Was taking 3 Aleve in AM & 3 Aleve in PM-stopped 2 months ago.  Documented significant wt loss per last note from Jocelyn Halls, NP 04/19/11.  Scheduled for EGD this week & plans to have flex sig since diarrhea & bleeding no better.  Requesting more pain meds. UA negative, CBC normal.  CMP normal in ER.  04/26/11 CT abd/pelvis w/ IV contrast normal.  C diff & giardia negative.  Lactoferrin positive. Past Medical History  Diagnosis Date  . GERD (gastroesophageal reflux disease)   . IBS (irritable bowel syndrome)   . S/P endoscopy April 2011    erosive esophagitis, s/p Maloney dilation  . S/P colonoscopy April 2011    rectal polyp, otherwise normal  . Depression   . Schizophrenia   . Anxiety disorder   . Schatzki's ring     Past Surgical History  Procedure Date  . Total abdominal hysterectomy     Current Outpatient Prescriptions  Medication Sig Dispense Refill  . ALPRAZolam (XANAX) 0.5 MG tablet Take 0.5 mg by mouth 1 dose over 24 hours.        . clidinium-chlordiazePOXIDE (LIBRAX) 2.5-5 MG per capsule Take 1 capsule by mouth 4 (four) times daily -  before meals and at bedtime.        . clonazePAM (KLONOPIN) 2 MG tablet Take  2 mg by mouth 2 (two) times daily as needed.        Marland Kitchen dexlansoprazole (DEXILANT) 60 MG capsule Take 1 capsule (60 mg total) by mouth daily.  31 capsule  3  . hydrocortisone (ANUSOL-HC) 25 MG suppository Place 25 mg rectally 2 (two) times daily.        Marland Kitchen lithium 300 MG capsule Take 150 mg by mouth at bedtime.       Marland Kitchen omeprazole (PRILOSEC) 20 MG capsule Take 20 mg by mouth daily.        Marland Kitchen oxyCODONE-acetaminophen (PERCOCET) 5-325 MG per tablet Take 1 tablet by mouth every 6 (six) hours as needed.        Marland Kitchen QUEtiapine (SEROQUEL) 300 MG tablet Take 900 mg by mouth at bedtime.          Allergies as of 05/01/2011 - Review Complete 05/01/2011  Allergen Reaction Noted  . Ibuprofen    . Propoxyphene n-acetaminophen      Family History  Problem Relation Age of Onset  . Colon cancer Mother     [name]    History   Social History  . Marital Status: Single    Spouse Name: N/A    Number of Children: N/A  .  Years of Education: N/A   Occupational History  . Not on file.   Social History Main Topics  . Smoking status: Current Everyday Smoker -- 1.0 packs/day for 26 years    Types: Cigarettes  . Smokeless tobacco: Never Used  . Alcohol Use: No  . Drug Use: No  . Sexually Active: No   Other Topics Concern  . Not on file   Social History Narrative  . No narrative on file    Review of Systems: Gen: c/o chills, sweats, anorexia, fatigue, weakness, malaise, weight loss, and sleep disorder CV: Denies chest pain, angina, palpitations, syncope, orthopnea, PND, peripheral edema, and claudication. Resp: Denies dyspnea at rest, dyspnea with exercise, cough, sputum, wheezing, coughing up blood, and pleurisy. GI: Denies vomiting blood, jaundice, and fecal incontinence.   Derm: Denies rash, itching, dry skin, hives, moles, warts, or unhealing ulcers.  Psych: Denies depression, anxiety, memory loss, suicidal ideation, hallucinations, paranoia, and confusion. Heme: Denies bruising and  enlarged lymph nodes.  Physical Exam: BP 110/73  Pulse 74  Temp(Src) 97.2 F (36.2 C) (Temporal)  Ht 5\' 6"  (1.676 m)  Wt 174 lb (78.926 kg)  BMI 28.08 kg/m2 General:   Alert,  Well-developed, well-nourished, pleasant and cooperative in NAD Head:  Normocephalic and atraumatic. Eyes:  Sclera clear, no icterus.   Conjunctiva pink. Mouth:  No deformity or lesions, dentition normal. Neck:  Supple; no masses or thyromegaly. Heart:  Regular rate and rhythm; no murmurs, clicks, rubs,  or gallops. Abdomen:  Soft & nondistended.  Mild tenderness to entire abd @ umbilicus.  No masses, hepatosplenomegaly or hernias noted. Normal bowel sounds, without guarding, and without rebound.   Msk:  Symmetrical without gross deformities. Normal posture. Pulses:  Normal pulses noted. Extremities:  Without clubbing or edema. Neurologic:  Alert and  oriented x4;  grossly normal neurologically. Skin:  Intact without significant lesions or rashes.  Multiple tattoos. Cervical Nodes:  No significant cervical adenopathy. Psych:  Alert and cooperative. Normal mood and affect.

## 2011-05-01 NOTE — Assessment & Plan Note (Signed)
Acute on chronic abd pain.  Previous GI work-up benign, felt to have IBS & functional abdominal pain.  Now with documented weight loss & hematochezia.  Will proceed w/ adding flex sigmoidoscopy on to EGD already planned for dysphagia to r/o evolving inflammatory bowel disease.  If no significant findings, pt would benefit from Pain Clinic Management as their appears to be some on-going dependency issues.    I have discussed risks & benefits which include, but are not limited to, bleeding, infection, perforation & drug reaction.  The patient agrees with this plan & written consent will be obtained.

## 2011-05-01 NOTE — Assessment & Plan Note (Addendum)
Recurrent solid/liquid dysphagia.  Suspect recurrent Schatzki's ring.  Proceed w/ EGD/esophageal dilation as planned.  Continue dexilant 60mg  daily #20 samples & rebate card given You may use pain meds given in ER for pain until procedure

## 2011-05-01 NOTE — Patient Instructions (Addendum)
Please call PAIN CLINIC to set up appt  Esophagitis (Heartburn) Esophagitis (heartburn) is a painful, burning sensation in the chest. It may feel worse in certain positions, such as lying down or bending over. It is caused by stomach acid backing up into the tube that carries food from the mouth down to the stomach (lower esophagus). TREATMENT There are a number of non-prescription medicines used to treat heartburn, including:  Antacids.   Acid reducers (also called H-2 blockers).   Proton-pump inhibitors.  HOME CARE INSTRUCTIONS  Raise the head of your bead by putting blocks under the legs.   Eat 2-3 hours before going to bed.   Stop smoking.   Try to reach and maintain a healthy weight.   Do not eat just a few very large meals. Instead, eat many smaller meals throughout the day.   Try to identify foods and beverages that make your symptoms worse, and avoid these.   Avoid tight clothing.   Do not exercise right after eating.  SEEK IMMEDIATE MEDICAL CARE IF YOU:  Have severe chest pain that goes down your arm, or into your jaw or neck.   Feel sweaty, dizzy, or lightheaded.   Are short of breath.   Throw up (vomit) blood.   Have difficulty or pain with swallowing.   Have bloody or black, tarry stools.   Have bouts of heartburn more than three times a week for more than two weeks.  Document Released: 12/14/2004 Document Re-Released: 01/31/2010 [facility] Patient Information 2011 [place], [place].

## 2011-05-02 NOTE — Progress Notes (Signed)
No PCP on file 

## 2011-05-04 ENCOUNTER — Encounter: Payer: Self-pay | Admitting: Internal Medicine

## 2011-05-04 ENCOUNTER — Ambulatory Visit (HOSPITAL_COMMUNITY)
Admission: RE | Admit: 2011-05-04 | Discharge: 2011-05-04 | Disposition: A | Discharge status: Home / Self Care | Payer: Self-pay | Source: Ambulatory Visit | Attending: Internal Medicine | Admitting: Internal Medicine

## 2011-05-04 ENCOUNTER — Other Ambulatory Visit: Payer: Self-pay | Admitting: Internal Medicine

## 2011-05-04 DIAGNOSIS — R197 Diarrhea, unspecified: Secondary | ICD-10-CM

## 2011-05-04 DIAGNOSIS — K5289 Other specified noninfective gastroenteritis and colitis: Secondary | ICD-10-CM | POA: Insufficient documentation

## 2011-05-04 DIAGNOSIS — R1310 Dysphagia, unspecified: Secondary | ICD-10-CM

## 2011-05-04 DIAGNOSIS — Q393 Congenital stenosis and stricture of esophagus: Secondary | ICD-10-CM

## 2011-05-04 DIAGNOSIS — Q391 Atresia of esophagus with tracheo-esophageal fistula: Secondary | ICD-10-CM

## 2011-05-04 DIAGNOSIS — Z79899 Other long term (current) drug therapy: Secondary | ICD-10-CM | POA: Insufficient documentation

## 2011-05-04 DIAGNOSIS — K222 Esophageal obstruction: Secondary | ICD-10-CM | POA: Insufficient documentation

## 2011-05-04 DIAGNOSIS — K219 Gastro-esophageal reflux disease without esophagitis: Secondary | ICD-10-CM

## 2011-05-04 LAB — CLOSTRIDIUM DIFFICILE BY PCR: Toxigenic C. Difficile by PCR: NEGATIVE

## 2011-05-05 LAB — OVA AND PARASITE EXAMINATION

## 2011-05-05 LAB — FECAL LACTOFERRIN, QUANT: Fecal Lactoferrin: POSITIVE

## 2011-05-08 LAB — STOOL CULTURE

## 2011-05-09 NOTE — Progress Notes (Unsigned)
probably Can't afford mesalamine either; we can try pepto-bismol 3- 262 mg tabs TID X 8 weeks; then ov w extender ; warn about dark stools ----- Message ----- From: Evalee Mutton, LPN Sent: [id number] 4:04 PM To: Corbin Ade, MD  Pt has no insurance and cannot afford entocort. Please advise               Pt aware, please schedule ov in 8 weeks with extender.

## 2011-05-11 NOTE — Progress Notes (Signed)
No PCP on file 

## 2011-05-11 NOTE — Progress Notes (Signed)
Pt is aware of OV for 7/16 @ 10 with AS

## 2011-05-17 ENCOUNTER — Telehealth: Payer: Self-pay

## 2011-05-17 NOTE — Telephone Encounter (Signed)
Informed pt that As will contact [facility] and we will have to get back with her about her recommendations. Pt requesting samples of dexilant. Left #15 at front desk and #3 boxes of align per AS. Pt to continue pepto. Pt asked if she should just go to ED. Advised pt that if she felt dehydrated she would need to go to ED. Advised pt that we do not prescribe narcotics for microscopic colitis.

## 2011-05-17 NOTE — Telephone Encounter (Signed)
Pt called- she feels like she is having a "flare" again. Pt stated she had a lot of abd pain and diarrhea. Asked pt if she was taking the pepto like she was instructed and pt stated yes. She is not having black stools, no blood, no N/V, no fever. Pt is requesting pain meds to last until she come in for ov in 6 weeks. Please advise.

## 2011-05-17 NOTE — Telephone Encounter (Signed)
Pt has been on pepto 1 week. Doubt microscopic colitis is source of abdominal pain. Has hx of chronic abdominal pain, IBS overlay. Has failed multiple treatments for IBS. Will not prescribe narcotics at this point. Let's wait a few more days with pepto. May need to try a mesalamine in the near future. Will d/w Dr. Darrick Penna.  Institute probiotic if not already taking.

## 2011-05-18 NOTE — Telephone Encounter (Signed)
Reviewed with Dr. Darrick Penna. Ensure taking Pepto Bismol, 4 tablets, twice per day. May take imodium prn for loose stools up to 4 times per day.

## 2011-05-18 NOTE — Telephone Encounter (Signed)
Tried to call pt- busy 

## 2011-05-22 NOTE — Telephone Encounter (Signed)
Pt aware.

## 2011-06-05 ENCOUNTER — Ambulatory Visit: Payer: Self-pay | Admitting: Gastroenterology

## 2011-06-05 ENCOUNTER — Telehealth: Payer: Self-pay | Admitting: Gastroenterology

## 2011-06-05 NOTE — Op Note (Signed)
NAMELILLION, [name]              ACCOUNT NO.:  [id number]  MEDICAL RECORD NO.:  [id number]  LOCATION:  DAYP                          FACILITY:  [facility]  PHYSICIAN:  R. Roetta Sessions, MD FACP FACGDATE OF BIRTH:  [birth year]  DATE OF PROCEDURE:  05/04/2011 DATE OF DISCHARGE:                              OPERATIVE REPORT   PROCEDURE:  Esophagogastroduodenoscopy with Elease Hashimoto dilation followed by gastric and duodenal biopsy, followed by flexible sigmoidoscopy with biopsy and stool sampling.  INDICATIONS FOR PROCEDURE:  A 40 year old lady with recurrent esophageal dysphagia, background longstanding GERD, also chronic diarrhea.  EGD with dilation etc., and sigmoidoscopy now being done to further evaluate her symptoms.  Risks, benefits, limitations, alternatives, and imponderables have been discussed because her polypharmacy and psychiatric illness.  She is being done in the OR under deep sedation. Please see documentation medical record for more information.  PROCEDURE NOTE:  O2 saturation, blood pressure, pulse, and respirations were monitored throughout the entire procedure.  Propofol sedation per Dr. [facility].  INSTRUMENT:  Pentax video chip system.  FINDINGS:  EGD examination of the tubular esophagus revealed a noncritical Schatzki ring, otherwise esophageal mucosa appeared normal. EG junction easily traversed.  Stomach:  Gastric cavity was emptied and insufflated well with air.  Thorough examination of the gastric mucosa including retroflexed proximal stomach esophagogastric junction demonstrated a small hiatal hernia and a couple of antral nodules, the reminder of the gastric mucosa appeared normal.  Pylorus was patent, easily traversed.  Examination of the bulb second and third portion revealed no abnormalities.  Therapeutic/diagnostic maneuvers performed: Scope was withdrawn.  A 56-French Maloney dilators passed full insertion with ease look back to the ring had  been nicely ruptured without apparent complication, was minimal bleeding.  Subsequently, biopsy of nodular antrum and biopsies of D2-D3 were taken.  The patient tolerated the procedure well and was prepared for sigmoidoscopy.  Digital rectal exam revealed no abnormalities.  Endoscopic findings, prep was good for sigmoidoscopy.  Scope was advanced through the left colon up to 60 cm in one-to-one fashion.  I do believe the proximal descending colon was reached.  Scope was slowly and cautiously withdrawn.  All previously mentioned mucosal surfaces were again seen.  The colonic mucosa appeared normal.  Segmental biopsies of descending and sigmoid were taken for histologic study.  Scope was pulled down the rectum where thorough examination of the rectal mucosa including retroflexed view of anal verge demonstrated no abnormalities.  Stool sample was also submitted to lab.  The patient tolerated both procedures well.  She was taken to PACU in stable condition.  IMPRESSION:  Schatzki ring, status post dilation in hiatal hernia, nodular and antrum uncertain significance, status post biopsy, biopsy D2- D3.  Sigmoidoscopy findings, normal left colon as described above, status post segmental biopsy and stool sampling, normal rectum.  RECOMMENDATIONS: 1. Continue Dexilant 60 mg orally daily. 2. Follow up on pending studies. 3. Further recommendations to follow.     [name], MD FACP [facility]     RMR/MEDQ  D:  05/04/2011  T:  05/04/2011  Job:  [id number]  cc:   Dr. Tor Netters [place]  Electronically Signed by Lorrin Goodell  M.D. on 06/05/2011 09:17:40 AM

## 2011-06-06 NOTE — Telephone Encounter (Signed)
Make sure pt has appt coming up in the next few weeks for reassessment. Dx: microscopic colitis.

## 2011-06-07 NOTE — Telephone Encounter (Signed)
appt 06/14/11 w/AS

## 2011-06-14 ENCOUNTER — Ambulatory Visit: Payer: Self-pay | Admitting: Gastroenterology

## 2011-06-28 ENCOUNTER — Telehealth: Payer: Self-pay | Admitting: Gastroenterology

## 2011-06-28 NOTE — Telephone Encounter (Signed)
Attempted to call pt. Unable to reach, left message. Pt has appt for tomorrow, 8/9.

## 2011-06-28 NOTE — Telephone Encounter (Signed)
[name] wants you to call her back today for some medical advice, she is scheduled with you tomorrow at 2:30  [phone]

## 2011-06-29 ENCOUNTER — Ambulatory Visit: Payer: Self-pay | Admitting: Gastroenterology

## 2011-06-29 ENCOUNTER — Telehealth: Payer: Self-pay | Admitting: Gastroenterology

## 2011-06-29 NOTE — Telephone Encounter (Signed)
Agree. Pt has been scheduled multiple times at this office. Will NOT GIVE narcotics for chronic abdominal pain. Pt has hx microscopic colitis, and she was supposed to follow up with [place] regarding this. If she no-shows again, may need to discuss dismissal. This will be determined by physician.

## 2011-06-29 NOTE — Telephone Encounter (Signed)
I spoke with the pt and she stated she was in the hospital a few days ago and she is hurting and needs something for pain.  I explained our NO SHOW policy to the pt and she agreed to come in for another office visit.  If she misses that appt she could potentially be dismissed from our practice.  I also spoke with Gerrit Halls and she instructed me to tell the pt she can't have any pain meds. Pt is scheduled to see Gerrit Halls on 07/03/2011@3 :00pm..Pt is aware.

## 2011-06-29 NOTE — Telephone Encounter (Signed)
Patient called an stated she couldn't make it to her appointment today because she was in pain an couldn't get out of the bed. Now she is demanding to speak to a Doctor on the telephone ASAP!! Im routing this call to [facility]

## 2011-07-03 ENCOUNTER — Ambulatory Visit: Payer: Self-pay | Admitting: Gastroenterology

## 2011-07-03 ENCOUNTER — Telehealth: Payer: Self-pay | Admitting: Gastroenterology

## 2011-07-03 NOTE — Telephone Encounter (Signed)
Pt has no-showed on the following dates:  Aug 13 Aug 9 July 25 July 16  She was advised about our policy after the no-show on Aug 9th. How would you like to proceed?

## 2011-07-06 ENCOUNTER — Emergency Department (HOSPITAL_COMMUNITY)
Admission: EM | Admit: 2011-07-06 | Discharge: 2011-07-06 | Disposition: A | Discharge status: Home / Self Care | Payer: Medicaid Other | Attending: Emergency Medicine | Admitting: Emergency Medicine

## 2011-07-06 ENCOUNTER — Encounter (HOSPITAL_COMMUNITY): Payer: Self-pay | Admitting: *Deleted

## 2011-07-06 ENCOUNTER — Emergency Department (HOSPITAL_COMMUNITY): Payer: Medicaid Other

## 2011-07-06 DIAGNOSIS — S2239XA Fracture of one rib, unspecified side, initial encounter for closed fracture: Secondary | ICD-10-CM

## 2011-07-06 DIAGNOSIS — K589 Irritable bowel syndrome without diarrhea: Secondary | ICD-10-CM | POA: Insufficient documentation

## 2011-07-06 DIAGNOSIS — K219 Gastro-esophageal reflux disease without esophagitis: Secondary | ICD-10-CM | POA: Insufficient documentation

## 2011-07-06 DIAGNOSIS — S42009A Fracture of unspecified part of unspecified clavicle, initial encounter for closed fracture: Secondary | ICD-10-CM | POA: Insufficient documentation

## 2011-07-06 DIAGNOSIS — Y92009 Unspecified place in unspecified non-institutional (private) residence as the place of occurrence of the external cause: Secondary | ICD-10-CM | POA: Insufficient documentation

## 2011-07-06 DIAGNOSIS — F17200 Nicotine dependence, unspecified, uncomplicated: Secondary | ICD-10-CM | POA: Insufficient documentation

## 2011-07-06 MED ORDER — OXYCODONE-ACETAMINOPHEN 5-325 MG PO TABS
2.0000 | ORAL_TABLET | Freq: Once | ORAL | Status: AC
  Administered 2011-07-06: 2 via ORAL
  Filled 2011-07-06: qty 2

## 2011-07-06 MED ORDER — OXYCODONE-ACETAMINOPHEN 5-325 MG PO TABS: 1.0000 | ORAL_TABLET | Freq: Four times a day (QID) | ORAL | Status: AC | PRN
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
## 2011-07-06 NOTE — ED Notes (Signed)
MD at bedside. 
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
## 2011-11-04 ENCOUNTER — Encounter (HOSPITAL_COMMUNITY): Payer: Self-pay | Admitting: Emergency Medicine

## 2011-11-04 ENCOUNTER — Emergency Department (HOSPITAL_COMMUNITY)
Admission: EM | Admit: 2011-11-04 | Discharge: 2011-11-04 | Disposition: A | Discharge status: Home / Self Care | Payer: Medicaid Other | Attending: Emergency Medicine | Admitting: Emergency Medicine

## 2011-11-04 ENCOUNTER — Emergency Department (HOSPITAL_COMMUNITY): Payer: Medicaid Other

## 2011-11-04 DIAGNOSIS — K219 Gastro-esophageal reflux disease without esophagitis: Secondary | ICD-10-CM | POA: Insufficient documentation

## 2011-11-04 DIAGNOSIS — R296 Repeated falls: Secondary | ICD-10-CM | POA: Insufficient documentation

## 2011-11-04 DIAGNOSIS — F17200 Nicotine dependence, unspecified, uncomplicated: Secondary | ICD-10-CM | POA: Insufficient documentation

## 2011-11-04 DIAGNOSIS — IMO0002 Reserved for concepts with insufficient information to code with codable children: Secondary | ICD-10-CM | POA: Insufficient documentation

## 2011-11-04 DIAGNOSIS — S0990XA Unspecified injury of head, initial encounter: Secondary | ICD-10-CM | POA: Insufficient documentation

## 2011-11-04 DIAGNOSIS — R220 Localized swelling, mass and lump, head: Secondary | ICD-10-CM | POA: Insufficient documentation

## 2011-11-04 DIAGNOSIS — Z9889 Other specified postprocedural states: Secondary | ICD-10-CM | POA: Insufficient documentation

## 2011-11-04 DIAGNOSIS — F341 Dysthymic disorder: Secondary | ICD-10-CM | POA: Insufficient documentation

## 2011-11-04 DIAGNOSIS — Z79899 Other long term (current) drug therapy: Secondary | ICD-10-CM | POA: Insufficient documentation

## 2011-11-04 DIAGNOSIS — R51 Headache: Secondary | ICD-10-CM | POA: Insufficient documentation

## 2011-11-04 DIAGNOSIS — M25569 Pain in unspecified knee: Secondary | ICD-10-CM | POA: Insufficient documentation

## 2011-11-04 DIAGNOSIS — T148XXA Other injury of unspecified body region, initial encounter: Secondary | ICD-10-CM

## 2011-11-04 DIAGNOSIS — R221 Localized swelling, mass and lump, neck: Secondary | ICD-10-CM | POA: Insufficient documentation

## 2011-11-04 MED ORDER — ACETAMINOPHEN 325 MG PO TABS
650.0000 mg | ORAL_TABLET | Freq: Once | ORAL | Status: AC
  Administered 2011-11-04: 650 mg via ORAL
  Filled 2011-11-04: qty 2

## 2011-11-04 MED ORDER — TETANUS-DIPHTH-ACELL PERTUSSIS 5-2.5-18.5 LF-MCG/0.5 IM SUSP
0.5000 mL | Freq: Once | INTRAMUSCULAR | Status: AC
  Administered 2011-11-04: 0.5 mL via INTRAMUSCULAR
  Filled 2011-11-04: qty 0.5

## 2011-11-04 MED ORDER — CYCLOBENZAPRINE HCL 10 MG PO TABS: 10.0000 mg | ORAL_TABLET | Freq: Two times a day (BID) | ORAL | Status: AC | PRN
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
## 2011-11-04 NOTE — ED Notes (Signed)
Patient transported to CT 

## 2011-11-04 NOTE — ED Notes (Signed)
Pt allowed writer to remove clothing and place in pt gown and red socks, pt now resting in bed, verbalizes understanding of why she needs assistance w/ambulation, pt more compliant and willing to remain in bed and to call for assistance. Les EMT at bedside and instructed pt the need for C collar, pt allowed Les EMT to apply collar. Pt made a fall risk d/t ETOH on board causing unsteady gait and balance

## 2011-11-04 NOTE — ED Notes (Signed)
Patient has gotten dressed and taken herself off the backboard and stated that she was leaving. Patient assisted to wheelchair to prevent injury

## 2011-11-04 NOTE — ED Notes (Signed)
Patient took off head blocks and threw them in the floor. Speech is slurred
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
## 2011-11-04 NOTE — ED Notes (Signed)
Pt noted to have taken off her C Collar prior to this staff member arriving at bedside, and pt was half off back board. Pt advised to stay still and RN would be with pt as soon as possible. Pt. Then noted top have dressed her self, and start climbing out of bed. Security officer waved this staff member down shortly after, and was putting pt in a wheel chair b/c pt was walking down hallway to restroom. This staff member assisted in getting pt in wheelchair and taking pt to restroom. Pt to restroom, voided, and assisted back to feet, pt very unsteady on feet, back to wheel chair. Pt then taken to Blue Rm 2, and still will not stay in bed, very unsteady on feet. RN advised of same.

## 2011-11-04 NOTE — ED Notes (Signed)
Pt ambulated around Blue side of ED with min. Assist. Pt. Stated she was slightly dizzy half way around, but was fine to finish.
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
## 2012-03-27 ENCOUNTER — Encounter (HOSPITAL_COMMUNITY): Payer: Self-pay | Admitting: *Deleted

## 2012-03-27 ENCOUNTER — Emergency Department (HOSPITAL_COMMUNITY)
Admission: EM | Admit: 2012-03-27 | Discharge: 2012-03-27 | Disposition: A | Discharge status: Home / Self Care | Payer: 59 | Attending: Emergency Medicine | Admitting: Emergency Medicine

## 2012-03-27 DIAGNOSIS — Z79899 Other long term (current) drug therapy: Secondary | ICD-10-CM | POA: Insufficient documentation

## 2012-03-27 DIAGNOSIS — K219 Gastro-esophageal reflux disease without esophagitis: Secondary | ICD-10-CM | POA: Insufficient documentation

## 2012-03-27 DIAGNOSIS — G8929 Other chronic pain: Secondary | ICD-10-CM | POA: Insufficient documentation

## 2012-03-27 DIAGNOSIS — F1910 Other psychoactive substance abuse, uncomplicated: Secondary | ICD-10-CM

## 2012-03-27 DIAGNOSIS — K589 Irritable bowel syndrome without diarrhea: Secondary | ICD-10-CM | POA: Insufficient documentation

## 2012-03-27 DIAGNOSIS — F1010 Alcohol abuse, uncomplicated: Secondary | ICD-10-CM | POA: Insufficient documentation

## 2012-03-27 DIAGNOSIS — F1410 Cocaine abuse, uncomplicated: Secondary | ICD-10-CM | POA: Insufficient documentation

## 2012-03-27 DIAGNOSIS — R109 Unspecified abdominal pain: Secondary | ICD-10-CM | POA: Insufficient documentation

## 2012-03-27 DIAGNOSIS — F341 Dysthymic disorder: Secondary | ICD-10-CM | POA: Insufficient documentation

## 2012-03-27 DIAGNOSIS — Z8659 Personal history of other mental and behavioral disorders: Secondary | ICD-10-CM | POA: Insufficient documentation

## 2012-03-27 DIAGNOSIS — R197 Diarrhea, unspecified: Secondary | ICD-10-CM | POA: Insufficient documentation
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
## 2012-03-27 NOTE — ED Notes (Signed)
Please refer to letter from [facility] also.

## 2012-03-27 NOTE — ED Notes (Signed)
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 Last used cocaine yesterday. States she has relapsed, "I was clean for a while". Denies SI/HI

## 2012-03-27 NOTE — Discharge Instructions (Signed)
Chemical Dependency Chemical dependency is an addiction to drugs or alcohol. It is characterized by the repeated behavior of seeking out and using drugs and alcohol despite harmful consequences to the health and safety of ones self and others.  RISK FACTORS There are certain situations or behaviors that increase a person's risk for chemical dependency. These include:  A family history of chemical dependency.   A history of mental health issues, including depression and anxiety.   A home environment where drugs and alcohol are easily available to you.   Drug or alcohol use at a young age.  SYMPTOMS  The following symptoms can indicate chemical dependency:  Inability to limit the use of drugs or alcohol.   Nausea, sweating, shakiness, and anxiety that occurs when alcohol or drugs are not being used.   An increase in amount of drugs or alcohol that is necessary to get drunk or high.  People who experience these symptoms can assess their use of drugs and alcohol by asking themselves the following questions:  Have you been told by friends or family that they are worried about your use of alcohol or drugs?   Do friends and family ever tell you about things you did while drinking alcohol or using drugs that you do not remember?   Do you lie about using alcohol or drugs or about the amounts you use?   Do you have difficulty completing daily tasks unless you use alcohol or drugs?   Is the level of your work or school performance lower because of your drug or alcohol use?   Do you get sick from using drugs or alcohol but keep using anyway?   Do you feel uncomfortable in social situations unless you use alcohol or drugs?   Do you use drugs or alcohol to help forget problems?  An answer of yes to any of these questions may indicate chemical dependency. Professional evaluation is suggested. Document Released: 10/31/2001 Document Revised: 10/26/2011 Document Reviewed: 01/12/2011 [facility]  Patient Information 2012 [place], [place].  RESOURCE GUIDE  Dental Problems  Patients with Medicaid: [facility] [phone]                                           [phone] Phone:  [phone]                                                  Phone:  [phone]  If unable to pay or uninsured, contact:  [facility] or [facility]. to become qualified for the adult dental clinic.  Chronic Pain Problems Contact [facility]  [phone] Patients need to be referred by their primary care doctor.  Insufficient Money for Medicine Contact [employer]:  call "211" or [facility] 779-453-5176.  No Primary Care Doctor Call [facility]  [phone] Other agencies that provide inexpensive medical care    [facility]  [phone]    [facility] Medicine  [phone]    [facility]  [phone]    [facility]  [phone]  [facility]  6070983727    [facility]  [phone]  Psychological Services [facility] Behavioral Health  [phone] [facility]  [phone] [facility] Mental Health   [phone] (emergency services [phone])  Substance Abuse Resources [facility] Services  [phone] [facility] 5637898130 [facility] [place] [phone] Floydene Flock [phone] Residential & Outpatient Substance Abuse Program  [phone]  Abuse/Neglect [facility] Child Abuse Hotline 813-426-9846 [facility] Child Abuse Hotline (814)165-9536 (After Hours)  Emergency Shelter [facility] [phone]  Maternity Homes Room at the [place] [facility] [place] [phone] [facility] [phone]  MRSA Hotline #:   872-552-9823    [facility] Resources  [facility]     [employer]                          [facility] Dept. [address]  [place]                       [address]      [address]  Blondell Reveal Phone:  [phone]                                   Phone:  [phone]                 Phone:  [phone]  [facility] Mental Health Phone:  [phone]  [facility] Child Abuse Hotline 5124113841 [phone] (After Hours)

## 2012-03-27 NOTE — ED Provider Notes (Signed)
History     CSN: [id number]  Arrival date & time 03/27/12  1329   First MD Initiated Contact with Patient 03/27/12 1342      Chief Complaint  Patient presents with  . v70.1     The history is provided by the patient.  drug abuse Onset - a brief time ago Course - worsening Worsened by - stress Improved by - nothing  Pt reports for help with substance abuse.  She reports recent relapse and is using cocaine (she smokes it) and ETOH. Her last use of ETOH was over 3 days ago She denies fever/seizures/vomiting She reports chronic abdominal pain and chronic diarrhea that is not new No SI reported     Past Medical History  Diagnosis Date  . GERD (gastroesophageal reflux disease)   . IBS (irritable bowel syndrome)   . S/P endoscopy April 2011    erosive esophagitis, s/p Maloney dilation  . S/P colonoscopy April 2011    rectal polyp, otherwise normal  . Depression   . Schizophrenia   . Anxiety disorder   . Schatzki's ring     Past Surgical History  Procedure Date  . Total abdominal hysterectomy     Family History  Problem Relation Age of Onset  . Colon cancer Mother     [name]    History  Substance Use Topics  . Smoking status: Current Everyday Smoker -- 1.0 packs/day for 26 years    Types: Cigarettes  . Smokeless tobacco: Never Used  . Alcohol Use: No    OB History    Grav Para Term Preterm Abortions TAB SAB Ect Mult Living                  Review of Systems  Constitutional: Negative for fever.  Gastrointestinal: Negative for vomiting.  All other systems reviewed and are negative.    Allergies  Ibuprofen and Propoxyphene-acetaminophen  Home Medications   Current Outpatient Rx  Name Route Sig Dispense Refill  . ALPRAZOLAM 0.5 MG PO TABS Oral Take 0.5 mg by mouth 4 (four) times daily. anxiety    . CLINDINIUM-CHLORDIAZEPOXIDE 2.5-5 MG PO CAPS Oral Take 1 capsule by mouth 4 (four) times daily -  before meals and at bedtime.      .  OXYCODONE-ACETAMINOPHEN 5-325 MG PO TABS Oral Take 1 tablet by mouth every 6 (six) hours as needed. pain    . QUETIAPINE FUMARATE 300 MG PO TABS Oral Take 900 mg by mouth at bedtime.        BP 98/49  Pulse 71  Temp(Src) 97.5 F (36.4 C) (Oral)  Resp 16  Ht 5\' 5"  (1.651 m)  Wt 130 lb (58.968 kg)  BMI 21.63 kg/m2  SpO2 100%  Physical Exam CONSTITUTIONAL: Well developed/well nourished HEAD AND FACE: Normocephalic/atraumatic EYES: EOMI/PERRL ENMT: Mucous membranes moist NECK: supple no meningeal signs CV: S1/S2 noted, no murmurs/rubs/gallops noted LUNGS: Lungs are clear to auscultation bilaterally, no apparent distress ABDOMEN: soft GU:no cva tenderness NEURO: Pt is awake/alert, moves all extremitiesx4 EXTREMITIES: pulses normal, full ROM SKIN: warm, color normal PSYCH: flat affect but appropriate  ED Course  Procedures  2:33 PM Call placed to ACT Pt stable, denies SI   Seen by ACT, stable for d/c, no need emergent stabilization    MDM  Nursing notes reviewed and considered in documentation       I evaluated patient by myself and without scribe assistance  Joya Gaskins, MD 03/27/12 1847

## 2012-04-01 ENCOUNTER — Encounter (HOSPITAL_COMMUNITY): Payer: Self-pay

## 2012-04-01 ENCOUNTER — Emergency Department (HOSPITAL_COMMUNITY)
Admission: EM | Admit: 2012-04-01 | Discharge: 2012-04-01 | Discharge status: Against Medical Advice | Payer: 59 | Attending: Emergency Medicine | Admitting: Emergency Medicine

## 2012-04-01 DIAGNOSIS — K219 Gastro-esophageal reflux disease without esophagitis: Secondary | ICD-10-CM | POA: Insufficient documentation

## 2012-04-01 DIAGNOSIS — K589 Irritable bowel syndrome without diarrhea: Secondary | ICD-10-CM | POA: Insufficient documentation

## 2012-04-01 DIAGNOSIS — F341 Dysthymic disorder: Secondary | ICD-10-CM | POA: Insufficient documentation

## 2012-04-01 DIAGNOSIS — F1910 Other psychoactive substance abuse, uncomplicated: Secondary | ICD-10-CM

## 2012-04-01 DIAGNOSIS — Z8659 Personal history of other mental and behavioral disorders: Secondary | ICD-10-CM | POA: Insufficient documentation

## 2012-04-01 DIAGNOSIS — Z79899 Other long term (current) drug therapy: Secondary | ICD-10-CM | POA: Insufficient documentation

## 2012-04-01 LAB — COMPREHENSIVE METABOLIC PANEL
ALT: 16 U/L (ref 0–35)
AST: 25 U/L (ref 0–37)
Albumin: 3.7 g/dL (ref 3.5–5.2)
Alkaline Phosphatase: 61 U/L (ref 39–117)
BUN: 10 mg/dL (ref 6–23)
CO2: 27 mEq/L (ref 19–32)
Calcium: 9.1 mg/dL (ref 8.4–10.5)
Chloride: 105 mEq/L (ref 96–112)
Creatinine, Ser: 0.71 mg/dL (ref 0.50–1.10)
GFR calc Af Amer: >90 mL/min (ref >90)
GFR calc non Af Amer: >90 mL/min (ref >90)
Glucose, Bld: 85 mg/dL (ref 70–99)
Potassium: 4 mEq/L (ref 3.5–5.1)
Sodium: 141 mEq/L (ref 135–145)
Total Bilirubin: 0.2 mg/dL — ABNORMAL LOW (ref 0.3–1.2)
Total Protein: 6.4 g/dL (ref 6.0–8.3)

## 2012-04-01 LAB — DIFFERENTIAL
Basophils Absolute: 0.1 10*3/uL (ref 0.0–0.1)
Basophils Relative: 1 % (ref 0–1)
Eosinophils Absolute: 0.3 10*3/uL (ref 0.0–0.7)
Eosinophils Relative: 4 % (ref 0–5)
Lymphocytes Relative: 31 % (ref 12–46)
Lymphs Abs: 3 10*3/uL (ref 0.7–4.0)
Monocytes Absolute: 0.6 10*3/uL (ref 0.1–1.0)
Monocytes Relative: 7 % (ref 3–12)
Neutro Abs: 5.5 10*3/uL (ref 1.7–7.7)
Neutrophils Relative %: 57 % (ref 43–77)

## 2012-04-01 LAB — CBC
HCT: 38.6 % (ref 36.0–46.0)
Hemoglobin: 13.2 g/dL (ref 12.0–15.0)
MCH: 33 pg (ref 26.0–34.0)
MCHC: 34.2 g/dL (ref 30.0–36.0)
MCV: 96.5 fL (ref 78.0–100.0)
Platelets: 303 10*3/uL (ref 150–400)
RBC: 4 MIL/uL (ref 3.87–5.11)
RDW: 13.3 % (ref 11.5–15.5)
WBC: 9.5 10*3/uL (ref 4.0–10.5)

## 2012-04-01 LAB — ETHANOL: Alcohol, Ethyl (B): <11 mg/dL (ref 0–11)

## 2012-04-01 LAB — RAPID URINE DRUG SCREEN, HOSP PERFORMED
Amphetamines: NOT DETECTED
Barbiturates: NOT DETECTED
Benzodiazepines: POSITIVE — AB
Cocaine: NOT DETECTED
Opiates: NOT DETECTED
Tetrahydrocannabinol: POSITIVE — AB

## 2012-04-01 NOTE — ED Notes (Signed)
Pt left ED AMA.

## 2012-04-01 NOTE — ED Notes (Signed)
Sent for med clearance for detox from etoh, cocaine and marijuana, sts recent relapse, denies any SI or HI

## 2012-04-01 NOTE — ED Notes (Signed)
Here for medical clearance for [name] behavior health. States have been clean for seven years from marijuana and cocaine.  Patient states used because of stressors at home. Sister in law passed away and custody for her 2 children.  Used marijuana once and approx $1200 of cocaine. Patient calm and cooperative ax4 Airway intact bilateral equal chest rise and fall. Denies SI or HI.
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
## 2012-04-01 NOTE — ED Notes (Signed)
Pt in room, with friend at bed side, waiting to see ACT team.

## 2013-03-02 ENCOUNTER — Ambulatory Visit (HOSPITAL_COMMUNITY)
Admission: AD | Admit: 2013-03-02 | Discharge: 2013-03-02 | Disposition: A | Discharge status: Home / Self Care | Payer: MEDICAID | Attending: Psychiatry | Admitting: Psychiatry

## 2013-03-02 ENCOUNTER — Emergency Department (HOSPITAL_COMMUNITY)
Admission: EM | Admit: 2013-03-02 | Discharge: 2013-03-03 | Disposition: A | Discharge status: Home / Self Care | Payer: MEDICAID | Attending: Emergency Medicine | Admitting: Emergency Medicine

## 2013-03-02 ENCOUNTER — Encounter (HOSPITAL_COMMUNITY): Payer: Self-pay | Admitting: Licensed Clinical Social Worker

## 2013-03-02 ENCOUNTER — Encounter (HOSPITAL_COMMUNITY): Payer: Self-pay | Admitting: *Deleted

## 2013-03-02 DIAGNOSIS — F411 Generalized anxiety disorder: Secondary | ICD-10-CM | POA: Insufficient documentation

## 2013-03-02 DIAGNOSIS — F1120 Opioid dependence, uncomplicated: Secondary | ICD-10-CM | POA: Insufficient documentation

## 2013-03-02 DIAGNOSIS — F1310 Sedative, hypnotic or anxiolytic abuse, uncomplicated: Secondary | ICD-10-CM | POA: Insufficient documentation

## 2013-03-02 DIAGNOSIS — Z8719 Personal history of other diseases of the digestive system: Secondary | ICD-10-CM | POA: Insufficient documentation

## 2013-03-02 DIAGNOSIS — Z79899 Other long term (current) drug therapy: Secondary | ICD-10-CM | POA: Insufficient documentation

## 2013-03-02 DIAGNOSIS — F17200 Nicotine dependence, unspecified, uncomplicated: Secondary | ICD-10-CM | POA: Insufficient documentation

## 2013-03-02 DIAGNOSIS — K219 Gastro-esophageal reflux disease without esophagitis: Secondary | ICD-10-CM | POA: Insufficient documentation

## 2013-03-02 DIAGNOSIS — F1910 Other psychoactive substance abuse, uncomplicated: Secondary | ICD-10-CM

## 2013-03-02 DIAGNOSIS — F319 Bipolar disorder, unspecified: Secondary | ICD-10-CM | POA: Insufficient documentation

## 2013-03-02 DIAGNOSIS — Z3202 Encounter for pregnancy test, result negative: Secondary | ICD-10-CM | POA: Insufficient documentation

## 2013-03-02 DIAGNOSIS — E162 Hypoglycemia, unspecified: Secondary | ICD-10-CM | POA: Insufficient documentation

## 2013-03-02 DIAGNOSIS — F209 Schizophrenia, unspecified: Secondary | ICD-10-CM | POA: Insufficient documentation

## 2013-03-02 HISTORY — DX: Opioid dependence, uncomplicated: F11.20

## 2013-03-02 LAB — RAPID URINE DRUG SCREEN, HOSP PERFORMED
Barbiturates: NOT DETECTED
Benzodiazepines: POSITIVE — AB
Cocaine: NOT DETECTED
Tetrahydrocannabinol: NOT DETECTED

## 2013-03-02 LAB — COMPREHENSIVE METABOLIC PANEL
ALT: 16 U/L (ref 0–35)
Alkaline Phosphatase: 83 U/L (ref 39–117)
BUN: 12 mg/dL (ref 6–23)
Chloride: 102 mEq/L (ref 96–112)
GFR calc Af Amer: >90 mL/min (ref >90)
Glucose, Bld: 54 mg/dL — ABNORMAL LOW (ref 70–99)
Potassium: 3.8 mEq/L (ref 3.5–5.1)
Sodium: 137 mEq/L (ref 135–145)
Total Bilirubin: 0.2 mg/dL — ABNORMAL LOW (ref 0.3–1.2)
Total Protein: 7.1 g/dL (ref 6.0–8.3)

## 2013-03-02 LAB — URINALYSIS, ROUTINE W REFLEX MICROSCOPIC
Bilirubin Urine: NEGATIVE
Ketones, ur: NEGATIVE mg/dL
Leukocytes, UA: NEGATIVE
Nitrite: NEGATIVE
Specific Gravity, Urine: 1.014 (ref 1.005–1.030)
Urobilinogen, UA: 0.2 mg/dL (ref 0.0–1.0)

## 2013-03-02 LAB — CBC
HCT: 38.7 % (ref 36.0–46.0)
Hemoglobin: 13.3 g/dL (ref 12.0–15.0)
MCHC: 34.4 g/dL (ref 30.0–36.0)
RBC: 4.14 MIL/uL (ref 3.87–5.11)
WBC: 8.7 10*3/uL (ref 4.0–10.5)

## 2013-03-02 LAB — GLUCOSE, CAPILLARY

## 2013-03-02 LAB — ACETAMINOPHEN LEVEL: Acetaminophen (Tylenol), Serum: <15 ug/mL (ref 10–30)

## 2013-03-02 MED ORDER — NICOTINE 21 MG/24HR TD PT24
21.0000 mg | MEDICATED_PATCH | Freq: Every day | TRANSDERMAL | Status: DC
  Administered 2013-03-02 – 2013-03-03 (×2): 21 mg via TRANSDERMAL
  Filled 2013-03-02 (×2): qty 1

## 2013-03-02 MED ORDER — LORAZEPAM 1 MG PO TABS
1.0000 mg | ORAL_TABLET | Freq: Once | ORAL | Status: AC
  Administered 2013-03-02: 1 mg via ORAL
  Filled 2013-03-02: qty 1

## 2013-03-02 MED ORDER — ONDANSETRON HCL 4 MG PO TABS: 4.0000 mg | ORAL_TABLET | Freq: Three times a day (TID) | ORAL | Status: DC | PRN

## 2013-03-02 MED ORDER — LORAZEPAM 1 MG PO TABS
1.0000 mg | ORAL_TABLET | Freq: Three times a day (TID) | ORAL | Status: DC | PRN
  Administered 2013-03-03: 1 mg via ORAL
  Filled 2013-03-02: qty 1

## 2013-03-02 MED ORDER — CLONIDINE HCL 0.1 MG PO TABS
0.1000 mg | ORAL_TABLET | Freq: Once | ORAL | Status: AC
  Administered 2013-03-02: 0.1 mg via ORAL
  Filled 2013-03-02: qty 1

## 2013-03-02 MED ORDER — ACETAMINOPHEN 325 MG PO TABS: 650.0000 mg | ORAL_TABLET | ORAL | Status: DC | PRN

## 2013-03-02 NOTE — ED Notes (Signed)
Pt states shes here to get detox from opiates, usually takes 30-40 a day, different kinds, states been taking x 2 years, last time took any opiates was 0700 this morning took 2 pills.

## 2013-03-02 NOTE — ED Notes (Signed)
2 bags in locker/1 duffel bag in activtity

## 2013-03-02 NOTE — ED Notes (Signed)
Sandwich and soda given.

## 2013-03-02 NOTE — ED Notes (Signed)
Up to the bathrooom 

## 2013-03-02 NOTE — ED Notes (Signed)
Per ACT--Pt has a bed at [facility]

## 2013-03-02 NOTE — ED Notes (Addendum)
[name] w/ [place] probation office called and is aware that the pt is a patient here and is being admitted and can not charge the bracelet.  He will check back tomorrow.  Will notify pt

## 2013-03-02 NOTE — ED Notes (Signed)
[place] dispatch contacted [phone]) and they will notify the on call parole officer to notify them that the pt is here and is being admitted.

## 2013-03-02 NOTE — ED Provider Notes (Signed)
History     CSN: [id number]  Arrival date & time 03/02/13  1553   First MD Initiated Contact with Patient 03/02/13 1607      Chief Complaint  Patient presents with  . Medical Clearance    (Consider location/radiation/quality/duration/timing/severity/associated sxs/prior treatment) HPI Comments: [facility] from [facility] for medical clearance. She request inpatient treatment for opioid addiction. She states that daily she snorts pills.  She denies any other drug use. She denies alcohol use. She does have a history of bipolar disorder but denies any depression or suicidal ideations. She denies any hallucinations. She currently denies any physical complaints.   Past Medical History  Diagnosis Date  . GERD (gastroesophageal reflux disease)   . IBS (irritable bowel syndrome)   . S/P endoscopy April 2011    erosive esophagitis, s/p Maloney dilation  . S/P colonoscopy April 2011    rectal polyp, otherwise normal  . Depression   . Schizophrenia   . Anxiety disorder   . Schatzki's ring   . Opiate addiction     Past Surgical History  Procedure Laterality Date  . Total abdominal hysterectomy    . Joint replacement      Family History  Problem Relation Age of Onset  . Colon cancer Mother     [name]    History  Substance Use Topics  . Smoking status: Current Every Day Smoker -- 1.00 packs/day for 26 years    Types: Cigarettes  . Smokeless tobacco: Never Used  . Alcohol Use: No    OB History   Grav Para Term Preterm Abortions TAB SAB Ect Mult Living                  Review of Systems  Constitutional: Negative for fever, chills, diaphoresis and fatigue.  HENT: Negative for congestion, rhinorrhea and sneezing.   Eyes: Negative.   Respiratory: Negative for cough, chest tightness and shortness of breath.   Cardiovascular: Negative for chest pain and leg swelling.  Gastrointestinal: Negative for nausea, vomiting, abdominal pain,  diarrhea and blood in stool.  Genitourinary: Negative for frequency, hematuria, flank pain and difficulty urinating.  Musculoskeletal: Negative for back pain and arthralgias.  Skin: Negative for rash.  Neurological: Negative for dizziness, speech difficulty, weakness, numbness and headaches.  Psychiatric/Behavioral: Positive for agitation. Negative for suicidal ideas. The patient is nervous/anxious.     Allergies  Food; Ibuprofen; and Propoxyphene-acetaminophen  Home Medications   Current Outpatient Rx  Name  Route  Sig  Dispense  Refill  . ALPRAZolam (XANAX) 0.5 MG tablet   Oral   Take 2 mg by mouth 4 (four) times daily. anxiety         . amphetamine-dextroamphetamine (ADDERALL) 30 MG tablet   Oral   Take 30 mg by mouth 2 (two) times daily.         Marland Kitchen gabapentin (NEURONTIN) 300 MG capsule   Oral   Take 600 mg by mouth at bedtime.         Marland Kitchen HYDROcodone-acetaminophen (NORCO) 10-325 MG per tablet   Oral   Take 1 tablet by mouth every 6 (six) hours as needed for pain.         Marland Kitchen oxyCODONE-acetaminophen (PERCOCET) 5-325 MG per tablet   Oral   Take 1 tablet by mouth every 6 (six) hours as needed. pain         . oxymorphone (OPANA ER) 20 MG 12 hr tablet   Oral   Take 60 mg  by mouth every 12 (twelve) hours.         Marland Kitchen oxymorphone (OPANA ER) 40 MG 12 hr tablet   Oral   Take 60 mg by mouth every 12 (twelve) hours.         Marland Kitchen QUEtiapine (SEROQUEL) 300 MG tablet   Oral   Take 900 mg by mouth at bedtime.             BP 100/64  Pulse 73  Temp(Src) 98.2 F (36.8 C) (Oral)  Resp 20  SpO2 96%  Physical Exam  Constitutional: She is oriented to person, place, and time. She appears well-developed and well-nourished.  HENT:  Head: Normocephalic and atraumatic.  Eyes: Pupils are equal, round, and reactive to light.  Neck: Normal range of motion. Neck supple.  Cardiovascular: Normal rate, regular rhythm and normal heart sounds.   Pulmonary/Chest: Effort normal and  breath sounds normal. No respiratory distress. She has no wheezes. She has no rales. She exhibits no tenderness.  Abdominal: Soft. Bowel sounds are normal. There is no tenderness. There is no rebound and no guarding.  Musculoskeletal: Normal range of motion. She exhibits no edema.  Lymphadenopathy:    She has no cervical adenopathy.  Neurological: She is alert and oriented to person, place, and time.  Skin: Skin is warm and dry. No rash noted.  Psychiatric: She has a normal mood and affect.    ED Course  Procedures (including critical care time)  Results for orders placed during the hospital encounter of 03/02/13  ACETAMINOPHEN LEVEL      Result Value Range   Acetaminophen (Tylenol), Serum <15.0  10 - 30 ug/mL  CBC      Result Value Range   WBC 8.7  4.0 - 10.5 K/uL   RBC 4.14  3.87 - 5.11 MIL/uL   Hemoglobin 13.3  12.0 - 15.0 g/dL   HCT 16.1  09.6 - 04.5 %   MCV 93.5  78.0 - 100.0 fL   MCH 32.1  26.0 - 34.0 pg   MCHC 34.4  30.0 - 36.0 g/dL   RDW 40.9  81.1 - 91.4 %   Platelets 344  150 - 400 K/uL  COMPREHENSIVE METABOLIC PANEL      Result Value Range   Sodium 137  135 - 145 mEq/L   Potassium 3.8  3.5 - 5.1 mEq/L   Chloride 102  96 - 112 mEq/L   CO2 28  19 - 32 mEq/L   Glucose, Bld 54 (*) 70 - 99 mg/dL   BUN 12  6 - 23 mg/dL   Creatinine, Ser 7.82  0.50 - 1.10 mg/dL   Calcium 9.6  8.4 - 95.6 mg/dL   Total Protein 7.1  6.0 - 8.3 g/dL   Albumin 3.7  3.5 - 5.2 g/dL   AST 23  0 - 37 U/L   ALT 16  0 - 35 U/L   Alkaline Phosphatase 83  39 - 117 U/L   Total Bilirubin 0.2 (*) 0.3 - 1.2 mg/dL   GFR calc non Af Amer >90  >90 mL/min   GFR calc Af Amer >90  >90 mL/min  ETHANOL      Result Value Range   Alcohol, Ethyl (B) <11  0 - 11 mg/dL  SALICYLATE LEVEL      Result Value Range   Salicylate Lvl <2.0 (*) 2.8 - 20.0 mg/dL  URINE RAPID DRUG SCREEN (HOSP PERFORMED)      Result Value Range   Opiates POSITIVE (*)  NONE DETECTED   Cocaine NONE DETECTED  NONE DETECTED    Benzodiazepines POSITIVE (*) NONE DETECTED   Amphetamines NONE DETECTED  NONE DETECTED   Tetrahydrocannabinol NONE DETECTED  NONE DETECTED   Barbiturates NONE DETECTED  NONE DETECTED  URINALYSIS, ROUTINE W REFLEX MICROSCOPIC      Result Value Range   Color, Urine YELLOW  YELLOW   APPearance CLEAR  CLEAR   Specific Gravity, Urine 1.014  1.005 - 1.030   pH 7.0  5.0 - 8.0   Glucose, UA NEGATIVE  NEGATIVE mg/dL   Hgb urine dipstick NEGATIVE  NEGATIVE   Bilirubin Urine NEGATIVE  NEGATIVE   Ketones, ur NEGATIVE  NEGATIVE mg/dL   Protein, ur NEGATIVE  NEGATIVE mg/dL   Urobilinogen, UA 0.2  0.0 - 1.0 mg/dL   Nitrite NEGATIVE  NEGATIVE   Leukocytes, UA NEGATIVE  NEGATIVE  GLUCOSE, CAPILLARY      Result Value Range   Glucose-Capillary 95  70 - 99 mg/dL  GLUCOSE, CAPILLARY      Result Value Range   Glucose-Capillary 284 (*) 70 - 99 mg/dL  GLUCOSE, CAPILLARY      Result Value Range   Glucose-Capillary 96  70 - 99 mg/dL   Comment 1 Documented in Chart     Comment 2 Notify RN    POCT PREGNANCY, URINE      Result Value Range   Preg Test, Ur NEGATIVE  NEGATIVE   No results found.    1. Substance abuse       MDM  Patient was accepted to the behavioral health Hospital however when he noted that her initial blood sugar was 54 they require that she be observed here for 24 hours to make sure that her blood sugars were stable. She's been asymptomatic from hypoglycemia. She was given a meal tray. Her subsequent CABG was normal. Of note there was a CBG noted of to 284 that was put in as an error.        Rolan Bucco, MD 03/02/13 2308
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
## 2013-03-02 NOTE — [facility] Counselor (Signed)
Jacquelyne Balint, [facility] at [facility], reviewed Pt's labs. She states that because Pt's glucose is 54 she cannot be admitted to [facility] at this time. [facility] admission criteria indicates that blood glucose levels must not be "<70 or >350 for at least 24 hours."  Harlin Rain Patsy Baltimore, LPC, [facility] Assessment Counselor

## 2013-03-02 NOTE — ED Notes (Signed)
Patients jewrely/ear rings/watch removed and placed in belongings

## 2013-03-03 MED ORDER — DICYCLOMINE HCL 20 MG PO TABS: 20.0000 mg | ORAL_TABLET | Freq: Four times a day (QID) | ORAL | Status: DC | PRN

## 2013-03-03 MED ORDER — ONDANSETRON 4 MG PO TBDP: 4.0000 mg | ORAL_TABLET | Freq: Four times a day (QID) | ORAL | Status: DC | PRN

## 2013-03-03 MED ORDER — METHOCARBAMOL 500 MG PO TABS: 1000.0000 mg | ORAL_TABLET | Freq: Three times a day (TID) | ORAL | Status: DC | PRN

## 2013-03-03 MED ORDER — LOPERAMIDE HCL 2 MG PO CAPS: 2.0000 mg | ORAL_CAPSULE | ORAL | Status: DC | PRN

## 2013-03-03 MED ORDER — HYDROXYZINE HCL 25 MG PO TABS: 50.0000 mg | ORAL_TABLET | Freq: Four times a day (QID) | ORAL | Status: DC | PRN

## 2013-03-03 NOTE — ED Provider Notes (Signed)
Patient is in the emergency department for opiate abuse. She snorts opiates. She states she has had the problem for a couple years. She denies feeling suicidal. She states this morning she feels shaky and she couldn't sleep. I'm going to check her medications. Patient is on clonidine for  withdrawal however she is not on Atarax or Robaxin, these were added.  Ward Givens, MD 03/03/13 [phone]

## 2013-03-03 NOTE — ED Provider Notes (Signed)
09:27 Verne Spurr, PA from [facility] called and states they do not admit for opiate detox. Questioned the benzo's in her urine.   10:07 [place], ACT states patient is now requesting to be discharged, states she is not having withdrawal symptoms now. States she has an appointment with [facility] later this month in [place].   Ward Givens, MD 03/03/13 [phone]

## 2015-01-11 ENCOUNTER — Telehealth: Payer: Self-pay | Admitting: Internal Medicine

## 2015-01-11 NOTE — Telephone Encounter (Signed)
Patient called today saying that she needed to be seen by RMR or NUR "whoever is in there" because she has HEP C and needs to be treated. Patient was discharged from our practice in 2012 due to constant NO SHOWS. I recommended to the patient that she could call NUR office and gave her the number.

## 2015-01-11 NOTE — Telephone Encounter (Signed)
Patient has been discharged from our practice back in 2012 due to multiple no shows.

## 2015-06-07 ENCOUNTER — Encounter (INDEPENDENT_AMBULATORY_CARE_PROVIDER_SITE_OTHER): Payer: Self-pay | Admitting: *Deleted

## 2015-06-17 ENCOUNTER — Ambulatory Visit (INDEPENDENT_AMBULATORY_CARE_PROVIDER_SITE_OTHER): Payer: Medicaid Other | Admitting: Internal Medicine

## 2015-06-17 ENCOUNTER — Encounter (INDEPENDENT_AMBULATORY_CARE_PROVIDER_SITE_OTHER): Payer: Self-pay | Admitting: Internal Medicine

## 2015-06-17 VITALS — BP 108/80 | HR 72 | Temp 98.7°F | Ht 65.0 in | Wt 166.3 lb

## 2015-06-17 DIAGNOSIS — B1920 Unspecified viral hepatitis C without hepatic coma: Secondary | ICD-10-CM

## 2015-06-17 NOTE — Patient Instructions (Signed)
Labs.  OV in 3 months.  
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
## 2015-06-18 LAB — CBC WITH DIFFERENTIAL/PLATELET
BASOS PCT: 1 % (ref 0–1)
Basophils Absolute: 0.1 10*3/uL (ref 0.0–0.1)
EOS PCT: 1 % (ref 0–5)
Eosinophils Absolute: 0.1 10*3/uL (ref 0.0–0.7)
HCT: 40.8 % (ref 36.0–46.0)
HEMOGLOBIN: 13.6 g/dL (ref 12.0–15.0)
LYMPHS ABS: 3.5 10*3/uL (ref 0.7–4.0)
Lymphocytes Relative: 36 % (ref 12–46)
MCH: 30.6 pg (ref 26.0–34.0)
MCHC: 33.3 g/dL (ref 30.0–36.0)
MCV: 91.9 fL (ref 78.0–100.0)
MONO ABS: 0.7 10*3/uL (ref 0.1–1.0)
MPV: 9.9 fL (ref 8.6–12.4)
Monocytes Relative: 7 % (ref 3–12)
NEUTROS ABS: 5.3 10*3/uL (ref 1.7–7.7)
Neutrophils Relative %: 55 % (ref 43–77)
PLATELETS: 389 10*3/uL (ref 150–400)
RBC: 4.44 MIL/uL (ref 3.87–5.11)
RDW: 13.8 % (ref 11.5–15.5)
WBC: 9.7 10*3/uL (ref 4.0–10.5)

## 2015-06-18 LAB — DRUG SCREEN, URINE
AMPHETAMINE SCRN UR: POSITIVE — AB
BARBITURATE QUANT UR: NEGATIVE
Benzodiazepines.: POSITIVE — AB
COCAINE METABOLITES: NEGATIVE
Creatinine,U: 90.3 mg/dL
MARIJUANA METABOLITE: POSITIVE — AB
METHADONE: NEGATIVE
OPIATES: NEGATIVE
PROPOXYPHENE: NEGATIVE
Phencyclidine (PCP): NEGATIVE

## 2015-06-18 LAB — HEPATIC FUNCTION PANEL
ALBUMIN: 4.4 g/dL (ref 3.6–5.1)
ALK PHOS: 93 U/L (ref 33–115)
ALT: 12 U/L (ref 6–29)
AST: 17 U/L (ref 10–35)
BILIRUBIN DIRECT: 0.1 mg/dL (ref <=0.2)
BILIRUBIN TOTAL: 0.5 mg/dL (ref 0.2–1.2)
Indirect Bilirubin: 0.4 mg/dL (ref 0.2–1.2)
Total Protein: 7.1 g/dL (ref 6.1–8.1)

## 2015-06-18 LAB — TSH: TSH: 1.159 u[IU]/mL (ref 0.350–4.500)

## 2015-06-18 LAB — HEPATITIS C ANTIBODY: HCV AB: REACTIVE — AB

## 2015-06-18 LAB — HEPATITIS C RNA QUANTITATIVE
HCV QUANT LOG: 6.75 {Log} — AB (ref <1.18)
HCV QUANT: 5651201 [IU]/mL — AB (ref <15)

## 2015-06-23 ENCOUNTER — Ambulatory Visit (INDEPENDENT_AMBULATORY_CARE_PROVIDER_SITE_OTHER): Payer: Self-pay | Admitting: Internal Medicine

## 2015-06-24 ENCOUNTER — Telehealth (INDEPENDENT_AMBULATORY_CARE_PROVIDER_SITE_OTHER): Payer: Self-pay | Admitting: Internal Medicine

## 2015-06-24 ENCOUNTER — Ambulatory Visit (HOSPITAL_COMMUNITY)
Admission: RE | Admit: 2015-06-24 | Discharge: 2015-06-24 | Disposition: A | Discharge status: Home / Self Care | Payer: Medicaid Other | Source: Ambulatory Visit | Attending: Internal Medicine | Admitting: Internal Medicine

## 2015-06-24 DIAGNOSIS — B1920 Unspecified viral hepatitis C without hepatic coma: Secondary | ICD-10-CM | POA: Diagnosis not present

## 2015-06-24 LAB — HEPATITIS C GENOTYPE

## 2015-06-24 NOTE — Telephone Encounter (Signed)
[name], OV in 6 months

## 2015-06-30 ENCOUNTER — Encounter (INDEPENDENT_AMBULATORY_CARE_PROVIDER_SITE_OTHER): Payer: Self-pay | Admitting: *Deleted

## 2015-06-30 NOTE — Telephone Encounter (Signed)
Apt has been scheduled for 12/30/15 with Dorene Ar, NP.

## 2015-12-30 ENCOUNTER — Ambulatory Visit (INDEPENDENT_AMBULATORY_CARE_PROVIDER_SITE_OTHER): Payer: Self-pay | Admitting: Internal Medicine

## 2016-01-05 ENCOUNTER — Encounter (INDEPENDENT_AMBULATORY_CARE_PROVIDER_SITE_OTHER): Payer: Self-pay | Admitting: Internal Medicine

## 2016-01-05 ENCOUNTER — Ambulatory Visit (INDEPENDENT_AMBULATORY_CARE_PROVIDER_SITE_OTHER): Payer: Self-pay | Admitting: Internal Medicine

## 2016-02-29 ENCOUNTER — Encounter (INDEPENDENT_AMBULATORY_CARE_PROVIDER_SITE_OTHER): Payer: Self-pay | Admitting: *Deleted

## 2016-02-29 ENCOUNTER — Encounter (INDEPENDENT_AMBULATORY_CARE_PROVIDER_SITE_OTHER): Payer: Self-pay | Admitting: Internal Medicine

## 2016-02-29 ENCOUNTER — Other Ambulatory Visit (INDEPENDENT_AMBULATORY_CARE_PROVIDER_SITE_OTHER): Payer: Self-pay | Admitting: Internal Medicine

## 2016-02-29 ENCOUNTER — Ambulatory Visit (INDEPENDENT_AMBULATORY_CARE_PROVIDER_SITE_OTHER): Payer: Medicaid Other | Admitting: Internal Medicine

## 2016-02-29 VITALS — BP 128/84 | HR 72 | Temp 98.5°F | Ht 65.0 in | Wt 165.9 lb

## 2016-02-29 DIAGNOSIS — K219 Gastro-esophageal reflux disease without esophagitis: Secondary | ICD-10-CM

## 2016-02-29 DIAGNOSIS — B1920 Unspecified viral hepatitis C without hepatic coma: Secondary | ICD-10-CM | POA: Diagnosis not present

## 2016-02-29 DIAGNOSIS — R1319 Other dysphagia: Secondary | ICD-10-CM

## 2016-02-29 MED ORDER — OMEPRAZOLE 40 MG PO CPDR: 40.0000 mg | DELAYED_RELEASE_CAPSULE | Freq: Every day | ORAL | Status: DC

## 2016-02-29 NOTE — Progress Notes (Signed)
   Subjective:    Patient ID: [name], female    DOB: 11/12/1971, 45 y.o.   MRN: [id number]  HPI  Presents today with c/o dysphagia. Symptoms for the past couple of months. Worse in last couple of weeks. She says all foods are lodging. Hx of same and has underwent an EGD years ago by Dr. Jena Gaussourk.  Patient was discharged from their practice for numerous "No Shows:".  She was last seen by me in July of 2016 for Hepatitis C. She had a positive drug screen and she was put on hold.  Her appetite is not good. There has been no weight since her OV in July.  She has a BM x 4-6 a day. Stools are loose which is normal for her. No melena or BRRB.  She is not on a PPI at present. Her last EGD/ED was in 2011.   03/03/2010 EGD/ED/ Colonoscopy.  Dr Jena Gaussourk Circumferential esophageal erosions with a patulous EG junction and Schatzki' ring consistent with moderately severe reflux esophagitis status [facility]. Small hiatal hernia, single diminutive rectal polyp status post cold biopsy removal.  Biopsy:   . RECTUM, POLYP(S), : - POLYPOID COLONIC MUCOSA WITH REACTIVE LYMPHOID AGGREGATE AND OVERLYING CHANGES consistent with benign inflammatory polyp. - no adenomatous change or malignancy identified.    Review of Systems Past Medical History  Diagnosis Date  . GERD (gastroesophageal reflux disease)   . IBS (irritable bowel syndrome)   . S/P endoscopy April 2011    erosive esophagitis, s/p Maloney dilation  . S/P colonoscopy April 2011    rectal polyp, otherwise normal  . Depression   . Schizophrenia (HCC)   . Anxiety disorder   . Schatzki's ring   . Opiate addiction [facility](HCC)     Past Surgical History  Procedure Laterality Date  . Total abdominal hysterectomy    . Joint replacement      collar bone but removed last yesterday    Allergies  Allergen Reactions  . Food     "spicey food"  . Ibuprofen Other (See Comments)    G.I. Upset.   . Propoxyphene  N-Acetaminophen Nausea And Vomiting    No current outpatient prescriptions on file prior to visit.   No current facility-administered medications on file prior to visit.        Objective:   Physical ExamBlood pressure 128/84, pulse 72, temperature 98.5 F (36.9 C), height 5\' 5"  (1.651 m), weight 165 lb 14.4 oz (75.252 kg). Alert and oriented. Skin warm and dry. Oral mucosa is moist.   . Sclera anicteric, conjunctivae is pink. Thyroid not enlarged. No cervical lymphadenopathy. Lungs clear. Heart regular rate and rhythm.  Abdomen is soft. Bowel sounds are positive. No hepatomegaly. No abdominal masses felt. No tenderness.  No edema to lower extremities.  Multiple tattoos.         Assessment & Plan:  GERD/Dysphagia. Stricture as well as PUD needs to be ruled out. EGD/ED with  Propofol. Rx for Omeprazole BID  sent to her pharmacy.  Hepatitis C. Will get Hep C quaint, CBC, and Urine drug screen.

## 2016-02-29 NOTE — Patient Instructions (Signed)
EGD/ED. The risks and benefits such as perforation, bleeding, and infection were reviewed with the patient and is agreeable. 

## 2016-03-01 ENCOUNTER — Encounter (INDEPENDENT_AMBULATORY_CARE_PROVIDER_SITE_OTHER): Payer: Self-pay

## 2016-03-01 LAB — DRUG SCREEN, URINE
Amphetamine Screen, Ur: NEGATIVE
BARBITURATE QUANT UR: NEGATIVE
BENZODIAZEPINES.: POSITIVE — AB
Cocaine Metabolites: NEGATIVE
Creatinine,U: 58.97 mg/dL
Marijuana Metabolite: NEGATIVE
Methadone: NEGATIVE
OPIATES: POSITIVE — AB
PROPOXYPHENE: NEGATIVE
Phencyclidine (PCP): NEGATIVE

## 2016-03-01 LAB — CBC WITH DIFFERENTIAL/PLATELET
BASOS PCT: 1 %
Basophils Absolute: 80 cells/uL (ref 0–200)
EOS PCT: 2 %
Eosinophils Absolute: 160 cells/uL (ref 15–500)
HEMATOCRIT: 37.8 % (ref 35.0–45.0)
HEMOGLOBIN: 12.1 g/dL (ref 11.7–15.5)
LYMPHS ABS: 3680 {cells}/uL (ref 850–3900)
LYMPHS PCT: 46 %
MCH: 30 pg (ref 27.0–33.0)
MCHC: 32 g/dL (ref 32.0–36.0)
MCV: 93.8 fL (ref 80.0–100.0)
MONO ABS: 560 {cells}/uL (ref 200–950)
MPV: 10.3 fL (ref 7.5–12.5)
Monocytes Relative: 7 %
NEUTROS PCT: 44 %
Neutro Abs: 3520 cells/uL (ref 1500–7800)
Platelets: 359 10*3/uL (ref 140–400)
RBC: 4.03 MIL/uL (ref 3.80–5.10)
RDW: 13.6 % (ref 11.0–15.0)
WBC: 8 10*3/uL (ref 3.8–10.8)

## 2016-03-02 LAB — HEPATITIS C RNA QUANTITATIVE
HCV QUANT LOG: 6.98 {Log} — AB (ref <1.18)
HCV QUANT: 9655705 [IU]/mL — AB (ref <15)

## 2016-03-06 ENCOUNTER — Telehealth (INDEPENDENT_AMBULATORY_CARE_PROVIDER_SITE_OTHER): Payer: Self-pay | Admitting: Internal Medicine

## 2016-03-06 DIAGNOSIS — B1920 Unspecified viral hepatitis C without hepatic coma: Secondary | ICD-10-CM

## 2016-03-06 NOTE — Telephone Encounter (Signed)
Drug screen Thursday.

## 2016-03-07 ENCOUNTER — Telehealth (INDEPENDENT_AMBULATORY_CARE_PROVIDER_SITE_OTHER): Payer: Self-pay | Admitting: Internal Medicine

## 2016-03-07 NOTE — Telephone Encounter (Signed)
Patient called stated that she would like you to write her a prescription for something for pain.  Stated her stomach is hurting really bad.  Stated that she is supposed to have a procedure next month, but needs something for pain now.  [phone]  [name]

## 2016-03-07 NOTE — Telephone Encounter (Signed)
Patient advised we do not give pain meds here

## 2016-03-16 ENCOUNTER — Telehealth (INDEPENDENT_AMBULATORY_CARE_PROVIDER_SITE_OTHER): Payer: Self-pay | Admitting: Internal Medicine

## 2016-03-16 DIAGNOSIS — K219 Gastro-esophageal reflux disease without esophagitis: Secondary | ICD-10-CM

## 2016-03-16 MED ORDER — OMEPRAZOLE 40 MG PO CPDR: 40.0000 mg | DELAYED_RELEASE_CAPSULE | Freq: Two times a day (BID) | ORAL | Status: DC

## 2016-03-16 NOTE — Telephone Encounter (Signed)
Rx sent to her pharmacy 

## 2016-03-23 NOTE — Patient Instructions (Signed)
[name]  03/23/2016     @PREFPERIOPPHARMACY @   Your procedure is scheduled on 03/31/2016.  [facility]  Call this number if you have problems the morning of surgery:  [phone]   Remember:  Do not eat food or drink liquids after midnight.  Take these medicines the morning of surgery with A SIP OF WATER Gabapentin, Hydrocodone OR Oxycodone if needed, Prilosec, Phenergan if needed   Do not wear jewelry, make-up or nail polish.  Do not wear lotions, powders, or perfumes.  You may wear deodorant.  Do not shave 48 hours prior to surgery.  Men may shave face and neck.  Do not bring valuables to the hospital.  [facility] for any belongings or valuables.  Contacts, dentures or bridgework may not be worn into surgery.  Leave your suitcase in the car.  After surgery it may be brought to your room.  For patients admitted to the hospital, discharge time will be determined by your treatment team.  Patients discharged the day of surgery will not be allowed to drive home.    Please read over the following fact sheets that you were given. Anesthesia Post-op Instructions     PATIENT INSTRUCTIONS POST-ANESTHESIA  IMMEDIATELY FOLLOWING SURGERY:  Do not drive or operate machinery for the first twenty four hours after surgery.  Do not make any important decisions for twenty four hours after surgery or while taking narcotic pain medications or sedatives.  If you develop intractable nausea and vomiting or a severe headache please notify your doctor immediately.  FOLLOW-UP:  Please make an appointment with your surgeon as instructed. You do not need to follow up with anesthesia unless specifically instructed to do so.  WOUND CARE INSTRUCTIONS (if applicable):  Keep a dry clean dressing on the anesthesia/puncture wound site if there is drainage.  Once the wound has quit draining you may leave it open to air.  Generally you should leave the  bandage intact for twenty four hours unless there is drainage.  If the epidural site drains for more than 36-48 hours please call the anesthesia department.  QUESTIONS?:  Please feel free to call your physician or the hospital operator if you have any questions, and they will be happy to assist you.      Esophageal Dilatation Esophageal dilatation is a procedure to open a blocked or narrowed part of the esophagus. The esophagus is the long tube in your throat that carries food and liquid from your mouth to your stomach. The procedure is also called esophageal dilation.  You may need this procedure if you have a buildup of scar tissue in your esophagus that makes it difficult, painful, or even impossible to swallow. This can be caused by gastroesophageal reflux disease (GERD). In rare cases, people need this procedure because they have cancer of the esophagus or a problem with the way food moves through the esophagus. Sometimes you may need to have another dilatation to enlarge the opening of the esophagus gradually. LET [facility]ARE PROVIDER KNOW ABOUT:   Any allergies you have.  All medicines you are taking, including vitamins, herbs, eye drops, creams, and over-the-counter medicines.  Previous problems you or members of your family have had with the use of anesthetics.  Any blood disorders you have.  Previous surgeries you have had.  Medical conditions you have.  Any antibiotic medicines you are required to take before dental procedures. RISKS AND COMPLICATIONS Generally,  this is a safe procedure. However, problems can occur and include:  Bleeding from a tear in the lining of the esophagus.  A hole (perforation) in the esophagus. BEFORE THE PROCEDURE  Do not eat or drink anything after midnight on the night before the procedure or as directed by your health care provider.  Ask your health care provider about changing or stopping your regular medicines. This is especially  important if you are taking diabetes medicines or blood thinners.  Plan to have someone take you home after the procedure. PROCEDURE   You will be given a medicine that makes you relaxed and sleepy (sedative).  A medicine may be sprayed or gargled to numb the back of the throat.  Your health care provider can use various instruments to do an esophageal dilatation. During the procedure, the instrument used will be placed in your mouth and passed down into your esophagus. Options include:  Simple dilators. This instrument is carefully placed in the esophagus to stretch it.  Guided wire bougies. In this method, a flexible tube (endoscope) is used to insert a wire into the esophagus. The dilator is passed over this wire to enlarge the esophagus. Then the wire is removed.  Balloon dilators. An endoscope with a small balloon at the end is passed down into the esophagus. Inflating the balloon gently stretches the esophagus and opens it up. AFTER THE PROCEDURE  Your blood pressure, heart rate, breathing rate, and blood oxygen level will be monitored often until the medicines you were given have worn off.  Your throat may feel slightly sore and will probably still feel numb. This will improve slowly over time.  You will not be allowed to eat or drink until the throat numbness has resolved.  If this is a same-day procedure, you may be allowed to go home once you have been able to drink, urinate, and sit on the edge of the bed without nausea or dizziness.  If this is a same-day procedure, you should have a friend or family member with you for the next 24 hours after the procedure.   This information is not intended to replace advice given to you by your health care provider. Make sure you discuss any questions you have with your health care provider.   Document Released: 12/28/2005 Document Revised: 11/27/2014 Document Reviewed: 03/18/2014 Elsevier Interactive Patient Education 2016 [employer]. Esophagogastroduodenoscopy Esophagogastroduodenoscopy (EGD) is a procedure that is used to examine the lining of the esophagus, stomach, and first part of the small intestine (duodenum). A long, flexible, lighted tube with a camera attached (endoscope) is inserted down the throat to view these organs. This procedure is done to detect problems or abnormalities, such as inflammation, bleeding, ulcers, or growths, in order to treat them. The procedure lasts 5-20 minutes. It is usually an outpatient procedure, but it may need to be performed in a hospital in emergency cases. LET [facility] CARE PROVIDER KNOW ABOUT:  Any allergies you have.  All medicines you are taking, including vitamins, herbs, eye drops, creams, and over-the-counter medicines.  Previous problems you or members of your family have had with the use of anesthetics.  Any blood disorders you have.  Previous surgeries you have had.  Medical conditions you have. RISKS AND COMPLICATIONS Generally, this is a safe procedure. However, problems can occur and include:  Infection.  Bleeding.  Tearing (perforation) of the esophagus, stomach, or duodenum.  Difficulty breathing or not being able to breathe.  Excessive sweating.  Spasms  of the larynx.  Slowed heartbeat.  Low blood pressure. BEFORE THE PROCEDURE  Do not eat or drink anything after midnight on the night before the procedure or as directed by your health care provider.  Do not take your regular medicines before the procedure if your health care provider asks you not to. Ask your health care provider about changing or stopping those medicines.  If you wear dentures, be prepared to remove them before the procedure.  Arrange for someone to drive you home after the procedure. PROCEDURE  A numbing medicine (local anesthetic) may be sprayed in your throat for comfort and to stop you from gagging or coughing.  You will have an IV tube inserted in a vein in  your hand or arm. You will receive medicines and fluids through this tube.  You will be given a medicine to relax you (sedative).  A pain reliever will be given through the IV tube.  A mouth guard may be placed in your mouth to protect your teeth and to keep you from biting on the endoscope.  You will be asked to lie on your left side.  The endoscope will be inserted down your throat and into your esophagus, stomach, and duodenum.  Air will be put through the endoscope to allow your health care provider to clearly view the lining of your esophagus.  The lining of your esophagus, stomach, and duodenum will be examined. During the exam, your health care provider may:  Remove tissue to be examined under a microscope (biopsy) for inflammation, infection, or other medical problems.  Remove growths.  Remove objects (foreign bodies) that are stuck.  Treat any bleeding with medicines or other devices that stop tissues from bleeding (hot cautery, clipping devices).  Widen (dilate) or stretch narrowed areas of your esophagus and stomach.  The endoscope will be withdrawn. AFTER THE PROCEDURE  You will be taken to a recovery area for observation. Your blood pressure, heart rate, breathing rate, and blood oxygen level will be monitored often until the medicines you were given have worn off.  Do not eat or drink anything until the numbing medicine has worn off and your gag reflex has returned. You may choke.  Your health care provider should be able to discuss his or her findings with you. It will take longer to discuss the test results if any biopsies were taken.   This information is not intended to replace advice given to you by your health care provider. Make sure you discuss any questions you have with your health care provider.   Document Released: 03/09/2005 Document Revised: 11/27/2014 Document Reviewed: 10/09/2012 Elsevier Interactive Patient Education [employer]

## 2016-03-27 ENCOUNTER — Encounter (HOSPITAL_COMMUNITY)
Admission: RE | Admit: 2016-03-27 | Discharge: 2016-03-27 | Disposition: A | Discharge status: Home / Self Care | Payer: Medicaid Other | Source: Ambulatory Visit | Attending: Internal Medicine | Admitting: Internal Medicine

## 2016-03-28 ENCOUNTER — Encounter (HOSPITAL_COMMUNITY)
Admission: RE | Admit: 2016-03-28 | Discharge: 2016-03-28 | Disposition: A | Discharge status: Home / Self Care | Payer: Medicaid Other | Source: Ambulatory Visit | Attending: Internal Medicine | Admitting: Internal Medicine

## 2016-03-28 ENCOUNTER — Encounter (HOSPITAL_COMMUNITY): Payer: Self-pay

## 2016-03-28 VITALS — BP 108/54 | HR 71 | Temp 98.7°F | Resp 18 | Ht 65.0 in | Wt 165.0 lb

## 2016-03-28 DIAGNOSIS — Z01812 Encounter for preprocedural laboratory examination: Secondary | ICD-10-CM | POA: Insufficient documentation

## 2016-03-28 DIAGNOSIS — R1319 Other dysphagia: Secondary | ICD-10-CM

## 2016-03-28 DIAGNOSIS — K219 Gastro-esophageal reflux disease without esophagitis: Secondary | ICD-10-CM | POA: Diagnosis not present

## 2016-03-28 DIAGNOSIS — R1310 Dysphagia, unspecified: Secondary | ICD-10-CM | POA: Diagnosis not present

## 2016-03-28 HISTORY — DX: Inflammatory liver disease, unspecified: K75.9

## 2016-03-28 HISTORY — DX: Unspecified asthma, uncomplicated: J45.909

## 2016-03-28 LAB — BASIC METABOLIC PANEL
ANION GAP: 8 (ref 5–15)
BUN: 11 mg/dL (ref 6–20)
CALCIUM: 9.5 mg/dL (ref 8.9–10.3)
CO2: 27 mmol/L (ref 22–32)
Chloride: 106 mmol/L (ref 101–111)
Creatinine, Ser: 0.74 mg/dL (ref 0.44–1.00)
Glucose, Bld: 78 mg/dL (ref 65–99)
Potassium: 4.5 mmol/L (ref 3.5–5.1)
Sodium: 141 mmol/L (ref 135–145)

## 2016-03-28 NOTE — Pre-Procedure Instructions (Signed)
Patient given information to sign up for my chart at home. 

## 2016-03-31 ENCOUNTER — Encounter (HOSPITAL_COMMUNITY)
Admission: RE | Disposition: A | Discharge status: Home / Self Care | Payer: Self-pay | Source: Ambulatory Visit | Attending: Internal Medicine

## 2016-03-31 ENCOUNTER — Ambulatory Visit (HOSPITAL_COMMUNITY)
Admission: RE | Admit: 2016-03-31 | Discharge: 2016-03-31 | Disposition: A | Discharge status: Home / Self Care | Payer: Medicaid Other | Source: Ambulatory Visit | Attending: Internal Medicine | Admitting: Internal Medicine

## 2016-03-31 ENCOUNTER — Ambulatory Visit (HOSPITAL_COMMUNITY): Payer: Medicaid Other | Admitting: Anesthesiology

## 2016-03-31 ENCOUNTER — Encounter (HOSPITAL_COMMUNITY): Payer: Self-pay | Admitting: *Deleted

## 2016-03-31 DIAGNOSIS — R1319 Other dysphagia: Secondary | ICD-10-CM

## 2016-03-31 DIAGNOSIS — R1310 Dysphagia, unspecified: Secondary | ICD-10-CM | POA: Insufficient documentation

## 2016-03-31 DIAGNOSIS — Z01810 Encounter for preprocedural cardiovascular examination: Secondary | ICD-10-CM | POA: Diagnosis not present

## 2016-03-31 DIAGNOSIS — K219 Gastro-esophageal reflux disease without esophagitis: Secondary | ICD-10-CM | POA: Insufficient documentation

## 2016-03-31 DIAGNOSIS — R1314 Dysphagia, pharyngoesophageal phase: Secondary | ICD-10-CM | POA: Diagnosis not present

## 2016-03-31 DIAGNOSIS — K449 Diaphragmatic hernia without obstruction or gangrene: Secondary | ICD-10-CM | POA: Diagnosis not present

## 2016-03-31 HISTORY — PX: ESOPHAGOGASTRODUODENOSCOPY (EGD) WITH PROPOFOL: SHX5813

## 2016-03-31 HISTORY — PX: ESOPHAGEAL DILATION: SHX303

## 2016-03-31 LAB — RAPID URINE DRUG SCREEN, HOSP PERFORMED
AMPHETAMINES: NOT DETECTED
BARBITURATES: NOT DETECTED
Benzodiazepines: NOT DETECTED
COCAINE: NOT DETECTED
OPIATES: POSITIVE — AB
TETRAHYDROCANNABINOL: POSITIVE — AB

## 2016-03-31 SURGERY — ESOPHAGOGASTRODUODENOSCOPY (EGD) WITH PROPOFOL
Anesthesia: Monitor Anesthesia Care

## 2016-03-31 MED ORDER — PROPOFOL 10 MG/ML IV BOLUS
INTRAVENOUS | Status: DC | PRN
  Administered 2016-03-31 (×6): 10 mg via INTRAVENOUS

## 2016-03-31 MED ORDER — MIDAZOLAM HCL 2 MG/2ML IJ SOLN
INTRAMUSCULAR | Status: AC
  Filled 2016-03-31: qty 2

## 2016-03-31 MED ORDER — LIDOCAINE HCL (PF) 1 % IJ SOLN
INTRAMUSCULAR | Status: AC
  Filled 2016-03-31: qty 5

## 2016-03-31 MED ORDER — IPRATROPIUM-ALBUTEROL 0.5-2.5 (3) MG/3ML IN SOLN
3.0000 mL | Freq: Once | RESPIRATORY_TRACT | Status: AC
  Administered 2016-03-31: 3 mL via RESPIRATORY_TRACT

## 2016-03-31 MED ORDER — OXYCODONE HCL 10 MG PO TABS: 10.0000 mg | ORAL_TABLET | Freq: Three times a day (TID) | ORAL | Status: DC | PRN

## 2016-03-31 MED ORDER — ONDANSETRON HCL 4 MG/2ML IJ SOLN: 4.0000 mg | Freq: Once | INTRAMUSCULAR | Status: DC | PRN

## 2016-03-31 MED ORDER — DICYCLOMINE HCL 10 MG PO CAPS: 10.0000 mg | ORAL_CAPSULE | Freq: Three times a day (TID) | ORAL | Status: DC

## 2016-03-31 MED ORDER — PROPOFOL 500 MG/50ML IV EMUL
INTRAVENOUS | Status: DC | PRN
Start: 2016-03-31 — End: 2016-03-31
  Administered 2016-03-31: 100 ug/kg/min via INTRAVENOUS
  Administered 2016-03-31: 150 ug/kg/min via INTRAVENOUS

## 2016-03-31 MED ORDER — PROPOFOL 10 MG/ML IV BOLUS
INTRAVENOUS | Status: AC
  Filled 2016-03-31: qty 40

## 2016-03-31 MED ORDER — LACTATED RINGERS IV SOLN
INTRAVENOUS | Status: DC
  Administered 2016-03-31: 1000 mL via INTRAVENOUS

## 2016-03-31 MED ORDER — IPRATROPIUM-ALBUTEROL 0.5-2.5 (3) MG/3ML IN SOLN
RESPIRATORY_TRACT | Status: AC
  Filled 2016-03-31: qty 3

## 2016-03-31 MED ORDER — FENTANYL CITRATE (PF) 100 MCG/2ML IJ SOLN: 25.0000 ug | INTRAMUSCULAR | Status: DC | PRN

## 2016-03-31 MED ORDER — ROCURONIUM BROMIDE 50 MG/5ML IV SOLN
INTRAVENOUS | Status: AC
  Filled 2016-03-31: qty 1

## 2016-03-31 MED ORDER — FENTANYL CITRATE (PF) 100 MCG/2ML IJ SOLN
INTRAMUSCULAR | Status: DC | PRN
  Administered 2016-03-31 (×2): 25 ug via INTRAVENOUS
  Administered 2016-03-31: 50 ug via INTRAVENOUS

## 2016-03-31 MED ORDER — FENTANYL CITRATE (PF) 100 MCG/2ML IJ SOLN
INTRAMUSCULAR | Status: AC
  Filled 2016-03-31: qty 2

## 2016-03-31 MED ORDER — BUTAMBEN-TETRACAINE-BENZOCAINE 2-2-14 % EX AERO
2.0000 | INHALATION_SPRAY | Freq: Two times a day (BID) | CUTANEOUS | Status: DC
  Administered 2016-03-31: 2 via TOPICAL

## 2016-03-31 MED ORDER — LIDOCAINE HCL (CARDIAC) 10 MG/ML IV SOLN
INTRAVENOUS | Status: DC | PRN
  Administered 2016-03-31: 50 mg via INTRAVENOUS

## 2016-03-31 MED ORDER — MIDAZOLAM HCL 2 MG/2ML IJ SOLN
1.0000 mg | INTRAMUSCULAR | Status: DC | PRN
  Administered 2016-03-31 (×2): 2 mg via INTRAVENOUS
  Filled 2016-03-31: qty 2

## 2016-03-31 NOTE — Transfer of Care (Signed)
Immediate Anesthesia Transfer of Care Note  Patient: [name]  Procedure(s) Performed: Procedure(s) with comments: ESOPHAGOGASTRODUODENOSCOPY (EGD) WITH PROPOFOL (N/A) - 12:10 ESOPHAGEAL DILATION (N/A)  Patient Location: PACU  Anesthesia Type:MAC  Level of Consciousness: awake, alert  and patient cooperative  Airway & Oxygen Therapy: Patient Spontanous Breathing  Post-op Assessment: Report given to RN, Post -op Vital signs reviewed and stable and Patient moving all extremities  Post vital signs: Reviewed and stable    Last Pain:  Filed Vitals:   03/31/16 1238  PainSc: 10-Worst pain ever      Patients Stated Pain Goal: 8 (03/31/16 1009)  Complications: No apparent anesthesia complications

## 2016-03-31 NOTE — Op Note (Signed)
[facility] Patient Name: [name] Procedure Date: 03/31/2016 12:11 PM MRN: [id number] Date of Birth: 1971/08/02 Attending MD: Lionel December , MD CSN: [id number] Age: 45 Admit Type: Outpatient Procedure:                Upper GI endoscopy Indications:              Esophageal dysphagia, Gastro-esophageal reflux                            disease Providers:                Lionel December, MD, Nena Polio, RN, Calton Dach,                            Technician Referring MD:             [facility], MD Medicines:                Propofol per Anesthesia Complications:            No immediate complications. Estimated Blood Loss:     Estimated blood loss: none. Procedure:                Pre-Anesthesia Assessment:                           - Prior to the procedure, a History and Physical                            was performed, and patient medications and                            allergies were reviewed. The patient's tolerance of                            previous anesthesia was also reviewed. The risks                            and benefits of the procedure and the sedation                            options and risks were discussed with the patient.                            All questions were answered, and informed consent                            was obtained. Prior Anticoagulants: The patient has                            taken no previous anticoagulant or antiplatelet                            agents. ASA Grade Assessment: III - A patient with  severe systemic disease. After reviewing the risks                            and benefits, the patient was deemed in                            satisfactory condition to undergo the procedure.                           After obtaining informed consent, the endoscope was                            passed under direct vision. Throughout the                            procedure,  the patient's blood pressure, pulse, and                            oxygen saturations were monitored continuously. The                            EG-[id number] ([id number]) scope was introduced through the                            mouth, and advanced to the second part of duodenum.                            The upper GI endoscopy was accomplished without                            difficulty. The patient tolerated the procedure                            well. Scope In: 1:01:03 PM Scope Out: 1:07:38 PM Total Procedure Duration: 0 hours 6 minutes 35 seconds  Findings:      The examined esophagus was normal.      A 2 cm hiatal hernia was present.      The entire examined stomach was normal.      The duodenal bulb and second portion of the duodenum were normal.      No endoscopic abnormality was evident in the esophagus to explain the       patient's complaint of dysphagia. It was decided, however, to proceed       with dilation of the entire esophagus. The dilation site was examined       following endoscope reinsertion and showed no change and no bleeding,       mucosal tear or perforation. Impression:               - Normal esophagus.                           - 2 cm hiatal hernia.                           - Normal stomach.                           -  Normal duodenal bulb and second portion of the                            duodenum.                           - No endoscopic esophageal abnormality to explain                            patient's dysphagia. Esophagus dilated.                           - No specimens collected. Moderate Sedation:      Per Anesthesia Care      Per Anesthesia Care Recommendation:           - Patient has a contact number available for                            emergencies. The signs and symptoms of potential                            delayed complications were discussed with the                            patient. Return to normal activities tomorrow.                             Written discharge instructions were provided to the                            patient.                           - Patient has a contact number available for                            emergencies. The signs and symptoms of potential                            delayed complications were discussed with the                            patient. Return to normal activities tomorrow.                            Written discharge instructions were provided to the                            patient.                           - Resume previous diet today.                           - Continue present medications. Procedure Code(s):        --- Professional ---  40981, Esophagogastroduodenoscopy, flexible,                            transoral; diagnostic, including collection of                            specimen(s) by brushing or washing, when performed                            (separate procedure) Diagnosis Code(s):        --- Professional ---                           K44.9, Diaphragmatic hernia without obstruction or                            gangrene                           R13.14, Dysphagia, pharyngoesophageal phase                           K21.9, Gastro-esophageal reflux disease without                            esophagitis CPT copyright 2016 American Medical Association. All rights reserved. The codes documented in this report are preliminary and upon coder review may  be revised to meet current compliance requirements. Lionel December, MD Lionel December, MD 03/31/2016 1:15:56 PM This report has been signed electronically. Number of Addenda: 0

## 2016-03-31 NOTE — Anesthesia Procedure Notes (Signed)
Procedure Name: MAC Date/Time: 03/31/2016 12:37 PM Performed by: Franco NonesYATES, Antonio Creswell S Pre-anesthesia Checklist: Patient identified, Emergency Drugs available, Suction available, Timeout performed and Patient being monitored Patient Re-evaluated:Patient Re-evaluated prior to inductionOxygen Delivery Method: Non-rebreather mask

## 2016-03-31 NOTE — H&P (Signed)
[name] is an 45 y.o. female.   Chief Complaint: Patient is here for EGD and ED. HPI: A shunt is 45 year old Caucasian female was more than 20 years duration who now presents with solid food dysphagia. She points to suprasternal area site of bolus obstruction. She says heartburns [name] is well controlled unless she eats spicy food. She denies nausea vomiting. She also complains of upper and mid abdominal pain which occurs after meals and associated with urgency and diarrhea. She denies melena or rectal bleeding. She is also having shoulder pain and trying to get pain clinic imminence in [facility]. She states she has lost 20 pounds the last 7 months. She believes is due to her dysphagia. He has history of Schatzki's ring last EGD was in 2011 by Dr.Rourk.  Past Medical History  Diagnosis Date  . GERD (gastroesophageal reflux disease)   . IBS (irritable bowel syndrome)   . S/P endoscopy April 2011    erosive esophagitis, s/p Maloney dilation  . S/P colonoscopy April 2011    rectal polyp, otherwise normal  . Depression   . Schizophrenia (HCC)   . Anxiety disorder   . Schatzki's ring   . Opiate addiction (HCC)   . Asthma   . Hepatitis     Hep C    Past Surgical History  Procedure Laterality Date  . Total abdominal hysterectomy    . Joint replacement      x3; last removed x1 year ago.    Family History  Problem Relation Age of Onset  . Colon cancer Mother     [name]   Social History:  reports that she has been smoking Cigarettes.  She has a 26 pack-year smoking history. She has never used smokeless tobacco. She reports that she uses illicit drugs (Other-see comments, Cocaine, and Marijuana). She reports that she does not drink alcohol.  Allergies:  Allergies  Allergen Reactions  . Asa [Aspirin] Anaphylaxis and Hives  . Food     "spicey food"  . Ibuprofen Other (See Comments)    G.I. Upset.   . Propoxyphene N-Acetaminophen Nausea And Vomiting     Medications Prior to Admission  Medication Sig Dispense Refill  . cephALEXin (KEFLEX) 250 MG capsule Take 250 mg by mouth 4 (four) times daily.    Marland Kitchen gabapentin (NEURONTIN) 300 MG capsule Take 300 mg by mouth 3 (three) times daily.    Marland Kitchen HYDROcodone-acetaminophen (NORCO) 10-325 MG tablet Take 1 tablet by mouth every 6 (six) hours as needed for moderate pain.    Marland Kitchen omeprazole (PRILOSEC) 40 MG capsule Take 1 capsule (40 mg total) by mouth 2 (two) times daily. Before breakfast and supper 60 capsule 3  . Oxycodone HCl 10 MG TABS Take 1 tablet by mouth 4 (four) times daily as needed (pain).   0  . promethazine (PHENERGAN) 25 MG tablet Take 1 tablet by mouth every 4 (four) hours as needed for nausea or vomiting.   0    No results found for this or any previous visit (from the past 48 hour(s)). No results found.  ROS  Blood pressure 112/78, pulse 82, temperature 98 F (36.7 C), temperature source Oral, resp. rate 25, height  (1.651 m), weight 165 lb (74.844 kg), SpO2 98 %. Physical Exam  Constitutional: She appears well-developed and well-nourished.  HENT:  Mouth/Throat: Oropharynx is clear and moist.  Eyes: Conjunctivae are normal. No scleral icterus.  Neck: No thyromegaly present.  Cardiovascular: Normal rate, regular rhythm and  normal heart sounds.   No murmur heard. Respiratory: Effort normal and breath sounds normal.  GI:  Abdomen is symmetrical and soft with mild tenderness in mid epigastrium and LLQ. No organomegaly or masses.  Musculoskeletal: She exhibits no edema.  Lymphadenopathy:    She has no cervical adenopathy.  Neurological: She is alert.  Skin: Skin is warm and dry.  She has multiple tattoos including one on the right side of neck.     Assessment/Plan Solid food dysphagia in patient with chronic GERD. Postprandial abdominal pain with urgency and diarrhea appears to be due to IBS. EGD with ED.  Malissa HippoEHMAN,Anoushka Divito U, MD 03/31/2016, 12:30 PM

## 2016-03-31 NOTE — Anesthesia Postprocedure Evaluation (Signed)
Anesthesia Post Note  Patient: Nona DellMelinda L Baldo  Procedure(s) Performed: Procedure(s) (LRB): ESOPHAGOGASTRODUODENOSCOPY (EGD) WITH PROPOFOL (N/A) ESOPHAGEAL DILATION (N/A)  Patient location during evaluation: PACU Anesthesia Type: MAC Level of consciousness: awake and alert Pain management: satisfactory to patient Vital Signs Assessment: post-procedure vital signs reviewed and stable Respiratory status: spontaneous breathing Cardiovascular status: stable Anesthetic complications: no    Last Vitals:  Filed Vitals:   03/31/16 1321 03/31/16 1327  BP:  129/77  Pulse: 80 79  Temp:  36.4 C  Resp: 15 18    Last Pain:  Filed Vitals:   03/31/16 1335  PainSc: 5                  Thomasenia Dowse

## 2016-03-31 NOTE — Anesthesia Postprocedure Evaluation (Signed)
Anesthesia Post Note  Patient: [name]  Procedure(s) Performed: Procedure(s) (LRB): ESOPHAGOGASTRODUODENOSCOPY (EGD) WITH PROPOFOL (N/A) ESOPHAGEAL DILATION (N/A)  Patient location during evaluation: Short Stay Anesthesia Type: MAC Level of consciousness: awake and alert and oriented Respiratory status: spontaneous breathing Cardiovascular status: blood pressure returned to baseline Postop Assessment: no signs of nausea or vomiting Anesthetic complications: no    Last Vitals:  Filed Vitals:   03/31/16 1321 03/31/16 1327  BP:  129/77  Pulse: 80 79  Temp:  36.4 C  Resp: 15 18    Last Pain:  Filed Vitals:   03/31/16 1335  PainSc: 5                  Dilon Lank

## 2016-03-31 NOTE — Anesthesia Preprocedure Evaluation (Signed)
Anesthesia Evaluation  Patient identified by MRN, date of birth, ID band Patient awake    Reviewed: Allergy & Precautions, NPO status , Patient's Chart, lab work & pertinent test results  Airway Mallampati: II       Dental  (+) Teeth Intact, Dental Advisory Given   Pulmonary asthma , Current Smoker,     + decreased breath sounds      Cardiovascular Normal cardiovascular exam     Neuro/Psych  Headaches, Anxiety Depression Bipolar Disorder Schizophrenia    GI/Hepatic GERD  Medicated and Controlled,(+)     substance abuse  cocaine use and marijuana use, Hepatitis -  Endo/Other    Renal/GU      Musculoskeletal   Abdominal Normal abdominal exam  (+)   Peds  Hematology   Anesthesia Other Findings   Reproductive/Obstetrics                             Anesthesia Physical Anesthesia Plan  ASA: III  Anesthesia Plan: MAC   Post-op Pain Management:    Induction: Intravenous  Airway Management Planned: Nasal Cannula  Additional Equipment:   Intra-op Plan:   Post-operative Plan:   Informed Consent: I have reviewed the patients History and Physical, chart, labs and discussed the procedure including the risks, benefits and alternatives for the proposed anesthesia with the patient or authorized representative who has indicated his/her understanding and acceptance.     Plan Discussed with: CRNA  Anesthesia Plan Comments:         Anesthesia Quick Evaluation

## 2016-03-31 NOTE — Discharge Instructions (Signed)
Resume usual medications and diet. Dicyclomine 10 mg by mouth 30 minutes before each meal. No driving for 24 hours. Please call office with progress report next week.

## 2016-04-04 ENCOUNTER — Encounter (HOSPITAL_COMMUNITY): Payer: Self-pay | Admitting: Internal Medicine

## 2017-03-09 DIAGNOSIS — J45909 Unspecified asthma, uncomplicated: Secondary | ICD-10-CM | POA: Diagnosis not present

## 2017-03-09 DIAGNOSIS — S60221A Contusion of right hand, initial encounter: Secondary | ICD-10-CM | POA: Insufficient documentation

## 2017-03-09 DIAGNOSIS — Z79899 Other long term (current) drug therapy: Secondary | ICD-10-CM | POA: Insufficient documentation

## 2017-03-09 DIAGNOSIS — Y929 Unspecified place or not applicable: Secondary | ICD-10-CM | POA: Diagnosis not present

## 2017-03-09 DIAGNOSIS — Y999 Unspecified external cause status: Secondary | ICD-10-CM | POA: Insufficient documentation

## 2017-03-09 DIAGNOSIS — W231XXA Caught, crushed, jammed, or pinched between stationary objects, initial encounter: Secondary | ICD-10-CM | POA: Diagnosis not present

## 2017-03-09 DIAGNOSIS — Y939 Activity, unspecified: Secondary | ICD-10-CM | POA: Diagnosis not present

## 2017-03-09 DIAGNOSIS — F17210 Nicotine dependence, cigarettes, uncomplicated: Secondary | ICD-10-CM | POA: Diagnosis not present

## 2017-03-09 DIAGNOSIS — Z9660 Presence of unspecified orthopedic joint implant: Secondary | ICD-10-CM | POA: Diagnosis not present

## 2017-03-09 DIAGNOSIS — S6991XA Unspecified injury of right wrist, hand and finger(s), initial encounter: Secondary | ICD-10-CM | POA: Diagnosis present

## 2017-03-10 ENCOUNTER — Emergency Department (HOSPITAL_COMMUNITY): Payer: Medicaid Other

## 2017-03-10 ENCOUNTER — Emergency Department (HOSPITAL_COMMUNITY)
Admission: EM | Admit: 2017-03-10 | Discharge: 2017-03-10 | Disposition: A | Discharge status: Home / Self Care | Payer: Medicaid Other | Attending: Emergency Medicine | Admitting: Emergency Medicine

## 2017-03-10 ENCOUNTER — Encounter (HOSPITAL_COMMUNITY): Payer: Self-pay | Admitting: Oncology

## 2017-03-10 DIAGNOSIS — S60221A Contusion of right hand, initial encounter: Secondary | ICD-10-CM

## 2017-03-10 MED ORDER — DICLOFENAC SODIUM 1 % TD GEL: 4.0000 g | Freq: Four times a day (QID) | TRANSDERMAL | 0 refills | Status: DC

## 2017-03-10 MED ORDER — ACETAMINOPHEN 500 MG PO TABS
1000.0000 mg | ORAL_TABLET | Freq: Once | ORAL | Status: AC
  Administered 2017-03-10: 1000 mg via ORAL
  Filled 2017-03-10: qty 2

## 2017-03-10 NOTE — ED Provider Notes (Signed)
[facility]-EMERGENCY DEPT Provider Note   CSN: [id number] Arrival date & time: 03/09/17  2357  By signing my name below, I, Cynda Acres, attest that this documentation has been prepared under the direction and in the presence of Nakesha Ebrahim, MD. Electronically Signed: Cynda Acres, Scribe. 03/10/17. 1:24 AM.  History   Chief Complaint Chief Complaint  Patient presents with  . Hand Pain   HPI Comments: [name] is a 46 y.o. female with a history of polysubstance abuse, who presents to the Emergency Department complaining of sudden-onset, constant hand pain that began two hours prior to arrival. Patient states her hand was slammed in a car door 2 hours ago. Patient reports associated swelling. No modifying factors indicated. Patient denies any numbness, tingling, weakness, or any other symptoms.   Patient has been dismissed from pain management. Patient currently on oxycodone.   The history is provided by the patient. No language interpreter was used.  Hand Pain  This is a new problem. The current episode started 1 to 2 hours ago. The problem occurs constantly. The problem has not changed since onset.Nothing aggravates the symptoms. Nothing relieves the symptoms. She has tried nothing for the symptoms. The treatment provided no relief.    Past Medical History:  Diagnosis Date  . Anxiety disorder   . Asthma   . Depression   . GERD (gastroesophageal reflux disease)   . Hepatitis    Hep C  . IBS (irritable bowel syndrome)   . Opiate addiction (HCC)   . S/P colonoscopy [facility] 2011   rectal polyp, otherwise normal  . S/P endoscopy [facility] [place] 2011   erosive esophagitis, s/p Maloney dilation  . Schatzki's ring   . Schizophrenia [facility])     Patient Active Problem List   Diagnosis Date Noted  . IBS 03/28/2010  . ABDOMINAL PAIN 03/28/2010  . GERD 02/21/2010  . HEMATOCHEZIA 02/21/2010  . DYSPHAGIA UNSPECIFIED 02/21/2010  . DIARRHEA 02/21/2010  . SCHIZOPHRENIA 02/15/2010  .  BIPOLAR DISORDER UNSPECIFIED 02/15/2010  . DEPRESSION/ANXIETY 02/15/2010  . MIGRAINE HEADACHE 02/15/2010    Past Surgical History:  Procedure Laterality Date  . ESOPHAGEAL DILATION N/A 03/31/2016   Procedure: ESOPHAGEAL DILATION;  Surgeon: Malissa Hippo, MD;  Location: [facility] ENDO SUITE;  Service: Endoscopy;  Laterality: N/A;  . ESOPHAGOGASTRODUODENOSCOPY (EGD) WITH PROPOFOL N/A 03/31/2016   Procedure: ESOPHAGOGASTRODUODENOSCOPY (EGD) WITH PROPOFOL;  Surgeon: Malissa Hippo, MD;  Location: [facility] ENDO SUITE;  Service: Endoscopy;  Laterality: N/A;  12:10  . JOINT REPLACEMENT     x3; last removed x1 year ago.  Marland Kitchen TOTAL ABDOMINAL HYSTERECTOMY      OB History    No data available       Home Medications    Prior to Admission medications   Medication Sig Start Date End Date Taking? Authorizing Provider  dicyclomine (BENTYL) 10 MG capsule Take 1 capsule (10 mg total) by mouth 3 (three) times daily before meals. 03/31/16   Malissa Hippo, MD  gabapentin (NEURONTIN) 300 MG capsule Take 300 mg by mouth 3 (three) times daily.    Historical Provider, MD  HYDROcodone-acetaminophen (NORCO) 10-325 MG tablet Take 1 tablet by mouth every 6 (six) hours as needed for moderate pain.    Historical Provider, MD  omeprazole (PRILOSEC) 40 MG capsule Take 1 capsule (40 mg total) by mouth 2 (two) times daily. Before breakfast and supper 03/16/16   Len Blalock, NP  Oxycodone HCl 10 MG TABS Take 1 tablet (10 mg total) by mouth 3 (  three) times daily as needed (pain). 03/31/16   Malissa Hippo, MD  promethazine (PHENERGAN) 25 MG tablet Take 1 tablet by mouth every 4 (four) hours as needed for nausea or vomiting.  02/03/16   Historical Provider, MD    Family History Family History  Problem Relation Age of Onset  . Colon cancer Mother     [name]    Social History Social History  Substance Use Topics  . Smoking status: Current Every Day Smoker    Packs/day: 1.00    Years: 26.00    Types: Cigarettes  .  Smokeless tobacco: Never Used     Comment: 1 pack a day since age 77  . Alcohol use No     Allergies   Asa [aspirin]; Food; Ibuprofen; and Propoxyphene n-acetaminophen   Review of Systems Review of Systems  Respiratory: Negative for wheezing.   Musculoskeletal: Positive for arthralgias (right hand) and joint swelling (right hand).  Neurological: Negative for weakness and numbness.  All other systems reviewed and are negative.    Physical Exam Updated Vital Signs BP 108/69 (BP Location: Left Arm)   Pulse (!) 101   Temp 97.9 F (36.6 C) (Oral)   Resp 18   Wt 135 lb 9.6 oz (61.5 kg)   SpO2 100%   BMI 22.57 kg/m   Physical Exam  Constitutional: She is oriented to person, place, and time. She appears well-developed and well-nourished. No distress.  HENT:  Head: Normocephalic and atraumatic.  Mouth/Throat: Oropharynx is clear and moist.  Eyes: Conjunctivae and EOM are normal. Pupils are equal, round, and reactive to light.  Pupils went from 10 to 7.   Neck: Normal range of motion. Neck supple.  Cardiovascular: Normal rate, regular rhythm, normal heart sounds and intact distal pulses.   Pulmonary/Chest: Effort normal and breath sounds normal. She has no wheezes. She has no rales. She exhibits no tenderness.  Abdominal: Soft. Bowel sounds are normal. She exhibits no mass. There is no tenderness. There is no rebound and no guarding.  Musculoskeletal: Normal range of motion. She exhibits no edema, tenderness or deformity.       Right wrist: Normal.       Right hand: Normal. She exhibits normal capillary refill. Normal sensation noted. Decreased sensation is not present in the ulnar distribution, is not present in the medial distribution and is not present in the radial distribution. Normal strength noted.  3+ radial pulse. Cap refill less than two seconds. No snuff box. Right hand neurovascularly intact. Track marks on the right arm.   Neurological: She is alert and oriented to  person, place, and time. She displays normal reflexes.  Skin: Skin is warm and dry. Capillary refill takes less than 2 seconds.  Psychiatric: She has a normal mood and affect.  Nursing note and vitals reviewed.    ED Treatments / Results   Vitals:   03/10/17 0004  BP: 108/69  Pulse: (!) 101  Resp: 18  Temp: 97.9 F (36.6 C)    DIAGNOSTIC STUDIES: Oxygen Saturation is 100% on RA, normal by my interpretation.    COORDINATION OF CARE: 1:23 AM Discussed treatment plan with pt at bedside and pt agreed to plan, which includes anti-inflammatory pain medication and an ace wrap.    Radiology Dg Hand Complete Right  Result Date: 03/10/2017 CLINICAL DATA:  Shut right hand in car door with swelling and bruising EXAM: RIGHT HAND - COMPLETE 3+ VIEW COMPARISON:  None. FINDINGS: There is no evidence of  fracture or dislocation. There is no evidence of arthropathy or other focal bone abnormality. Soft tissues are unremarkable. IMPRESSION: Negative. Electronically Signed   By: Jasmine Pang M.D.   On: 03/10/2017 00:55    Procedures Procedures (including critical care time)  Medications Ordered in ED  Medications  acetaminophen (TYLENOL) tablet 1,000 mg (not administered)     Final Clinical Impressions(s) / ED Diagnoses  Contusion of the right hand:  Xray is normal. Exam is also completely normal without any redness bruising or swelling. Given substance abuse history and multiple allergies will provide ice and voltaren gel.    I personally performed the services described in this documentation, which was scribed in my presence. The recorded information has been reviewed and is accurate.       Cy Blamer, MD 03/10/17 [phone]

## 2017-03-10 NOTE — ED Triage Notes (Signed)
Pt states that she had her right hand shut in a car door approximately 1 hour ago.  Swelling and bruising noted.  Pt rates pain 7/10, throbbing in nature.

## 2017-03-10 NOTE — ED Notes (Signed)
Patient is alert and oriented x3.  She was given DC instructions and follow up visit instructions.  Patient gave verbal understanding. She was DC ambulatory under her own power to home.  V/S stable.  He was not showing any signs of distress on DC 

## 2017-06-18 ENCOUNTER — Telehealth (INDEPENDENT_AMBULATORY_CARE_PROVIDER_SITE_OTHER): Payer: Self-pay | Admitting: Internal Medicine

## 2017-06-18 NOTE — Telephone Encounter (Signed)
Patient called on Friday after hours and left a message that she would like an appointment with Dr. Karilyn Cotaehman, not Camelia Engerri.  I tried to reach the patient multiple times, her mailbox is not set up so I could not leave a message.  [phone]

## 2017-06-29 ENCOUNTER — Encounter (HOSPITAL_COMMUNITY): Payer: Self-pay

## 2017-06-29 ENCOUNTER — Emergency Department (HOSPITAL_COMMUNITY)
Admission: EM | Admit: 2017-06-29 | Discharge: 2017-06-29 | Disposition: A | Discharge status: Other Acute Inpatient Hospital | Payer: Medicaid Other | Attending: Psychiatry | Admitting: Psychiatry

## 2017-06-29 ENCOUNTER — Encounter (HOSPITAL_COMMUNITY): Payer: Self-pay | Admitting: Emergency Medicine

## 2017-06-29 ENCOUNTER — Inpatient Hospital Stay (HOSPITAL_COMMUNITY)
Admission: AD | Admit: 2017-06-29 | Discharge: 2017-07-04 | DRG: 885 | Disposition: A | Discharge status: Home / Self Care | Payer: Medicaid Other | Source: Intra-hospital | Attending: Psychiatry | Admitting: Psychiatry

## 2017-06-29 DIAGNOSIS — F333 Major depressive disorder, recurrent, severe with psychotic symptoms: Principal | ICD-10-CM | POA: Diagnosis present

## 2017-06-29 DIAGNOSIS — G4700 Insomnia, unspecified: Secondary | ICD-10-CM | POA: Diagnosis not present

## 2017-06-29 DIAGNOSIS — Z046 Encounter for general psychiatric examination, requested by authority: Secondary | ICD-10-CM | POA: Diagnosis not present

## 2017-06-29 DIAGNOSIS — M25519 Pain in unspecified shoulder: Secondary | ICD-10-CM | POA: Diagnosis present

## 2017-06-29 DIAGNOSIS — M791 Myalgia: Secondary | ICD-10-CM | POA: Diagnosis not present

## 2017-06-29 DIAGNOSIS — F419 Anxiety disorder, unspecified: Secondary | ICD-10-CM | POA: Diagnosis not present

## 2017-06-29 DIAGNOSIS — F1910 Other psychoactive substance abuse, uncomplicated: Secondary | ICD-10-CM | POA: Insufficient documentation

## 2017-06-29 DIAGNOSIS — Z886 Allergy status to analgesic agent status: Secondary | ICD-10-CM

## 2017-06-29 DIAGNOSIS — F1310 Sedative, hypnotic or anxiolytic abuse, uncomplicated: Secondary | ICD-10-CM | POA: Diagnosis not present

## 2017-06-29 DIAGNOSIS — F4310 Post-traumatic stress disorder, unspecified: Secondary | ICD-10-CM | POA: Diagnosis present

## 2017-06-29 DIAGNOSIS — Z79899 Other long term (current) drug therapy: Secondary | ICD-10-CM | POA: Insufficient documentation

## 2017-06-29 DIAGNOSIS — K589 Irritable bowel syndrome without diarrhea: Secondary | ICD-10-CM | POA: Diagnosis not present

## 2017-06-29 DIAGNOSIS — F1220 Cannabis dependence, uncomplicated: Secondary | ICD-10-CM | POA: Diagnosis not present

## 2017-06-29 DIAGNOSIS — K219 Gastro-esophageal reflux disease without esophagitis: Secondary | ICD-10-CM | POA: Diagnosis present

## 2017-06-29 DIAGNOSIS — F39 Unspecified mood [affective] disorder: Secondary | ICD-10-CM | POA: Diagnosis not present

## 2017-06-29 DIAGNOSIS — R45851 Suicidal ideations: Secondary | ICD-10-CM | POA: Diagnosis not present

## 2017-06-29 DIAGNOSIS — F17210 Nicotine dependence, cigarettes, uncomplicated: Secondary | ICD-10-CM | POA: Diagnosis present

## 2017-06-29 DIAGNOSIS — Z62810 Personal history of physical and sexual abuse in childhood: Secondary | ICD-10-CM | POA: Diagnosis not present

## 2017-06-29 DIAGNOSIS — F329 Major depressive disorder, single episode, unspecified: Secondary | ICD-10-CM | POA: Diagnosis present

## 2017-06-29 DIAGNOSIS — F332 Major depressive disorder, recurrent severe without psychotic features: Secondary | ICD-10-CM | POA: Diagnosis present

## 2017-06-29 DIAGNOSIS — F1420 Cocaine dependence, uncomplicated: Secondary | ICD-10-CM | POA: Diagnosis not present

## 2017-06-29 DIAGNOSIS — J45909 Unspecified asthma, uncomplicated: Secondary | ICD-10-CM | POA: Diagnosis not present

## 2017-06-29 DIAGNOSIS — M2550 Pain in unspecified joint: Secondary | ICD-10-CM | POA: Diagnosis not present

## 2017-06-29 DIAGNOSIS — F1994 Other psychoactive substance use, unspecified with psychoactive substance-induced mood disorder: Secondary | ICD-10-CM | POA: Diagnosis not present

## 2017-06-29 DIAGNOSIS — Z560 Unemployment, unspecified: Secondary | ICD-10-CM | POA: Diagnosis not present

## 2017-06-29 HISTORY — DX: Chronic pain syndrome: G89.4

## 2017-06-29 HISTORY — DX: Cocaine abuse, uncomplicated: F14.10

## 2017-06-29 LAB — COMPREHENSIVE METABOLIC PANEL
ALT: 20 U/L (ref 14–54)
AST: 27 U/L (ref 15–41)
Albumin: 4.3 g/dL (ref 3.5–5.0)
Alkaline Phosphatase: 60 U/L (ref 38–126)
Anion gap: 8 (ref 5–15)
BUN: 9 mg/dL (ref 6–20)
CO2: 28 mmol/L (ref 22–32)
Calcium: 9.4 mg/dL (ref 8.9–10.3)
Chloride: 106 mmol/L (ref 101–111)
Creatinine, Ser: 0.92 mg/dL (ref 0.44–1.00)
GFR calc non Af Amer: >60 mL/min (ref >60)
Glucose, Bld: 75 mg/dL (ref 65–99)
POTASSIUM: 3 mmol/L — AB (ref 3.5–5.1)
SODIUM: 142 mmol/L (ref 135–145)
Total Bilirubin: 0.4 mg/dL (ref 0.3–1.2)
Total Protein: 7.2 g/dL (ref 6.5–8.1)

## 2017-06-29 LAB — RAPID URINE DRUG SCREEN, HOSP PERFORMED
AMPHETAMINES: NOT DETECTED
Barbiturates: NOT DETECTED
Benzodiazepines: POSITIVE — AB
COCAINE: POSITIVE — AB
OPIATES: NOT DETECTED
TETRAHYDROCANNABINOL: POSITIVE — AB

## 2017-06-29 LAB — SALICYLATE LEVEL

## 2017-06-29 LAB — CBC
HCT: 39.3 % (ref 36.0–46.0)
HEMOGLOBIN: 13.5 g/dL (ref 12.0–15.0)
MCH: 32.5 pg (ref 26.0–34.0)
MCHC: 34.4 g/dL (ref 30.0–36.0)
MCV: 94.5 fL (ref 78.0–100.0)
PLATELETS: 284 10*3/uL (ref 150–400)
RBC: 4.16 MIL/uL (ref 3.87–5.11)
RDW: 12.5 % (ref 11.5–15.5)
WBC: 9.6 10*3/uL (ref 4.0–10.5)

## 2017-06-29 LAB — ETHANOL: Alcohol, Ethyl (B): <5 mg/dL (ref <5)

## 2017-06-29 LAB — ACETAMINOPHEN LEVEL

## 2017-06-29 MED ORDER — NICOTINE POLACRILEX 2 MG MT GUM
2.0000 mg | CHEWING_GUM | OROMUCOSAL | Status: DC | PRN
  Administered 2017-06-30: 2 mg via ORAL
  Filled 2017-06-29: qty 1

## 2017-06-29 MED ORDER — ALUM & MAG HYDROXIDE-SIMETH 200-200-20 MG/5ML PO SUSP
30.0000 mL | ORAL | Status: DC | PRN
  Administered 2017-07-02: 30 mL via ORAL
  Filled 2017-06-29: qty 30

## 2017-06-29 MED ORDER — QUETIAPINE FUMARATE 300 MG PO TABS
300.0000 mg | ORAL_TABLET | Freq: Once | ORAL | Status: AC
  Administered 2017-06-29: 300 mg via ORAL
  Filled 2017-06-29 (×2): qty 1

## 2017-06-29 MED ORDER — QUETIAPINE FUMARATE 300 MG PO TABS
300.0000 mg | ORAL_TABLET | Freq: Three times a day (TID) | ORAL | Status: DC
  Administered 2017-06-29 – 2017-06-30 (×2): 300 mg via ORAL
  Filled 2017-06-29 (×9): qty 1

## 2017-06-29 MED ORDER — POTASSIUM CHLORIDE CRYS ER 20 MEQ PO TBCR
40.0000 meq | EXTENDED_RELEASE_TABLET | Freq: Once | ORAL | Status: AC
  Administered 2017-06-29: 40 meq via ORAL
  Filled 2017-06-29: qty 2

## 2017-06-29 MED ORDER — PANTOPRAZOLE SODIUM 40 MG PO TBEC
40.0000 mg | DELAYED_RELEASE_TABLET | Freq: Every day | ORAL | Status: DC
  Administered 2017-06-29 – 2017-07-04 (×6): 40 mg via ORAL
  Filled 2017-06-29 (×8): qty 1

## 2017-06-29 MED ORDER — NICOTINE 21 MG/24HR TD PT24
21.0000 mg | MEDICATED_PATCH | Freq: Every day | TRANSDERMAL | Status: DC
  Administered 2017-06-29: 21 mg via TRANSDERMAL
  Filled 2017-06-29: qty 1

## 2017-06-29 MED ORDER — MAGNESIUM HYDROXIDE 400 MG/5ML PO SUSP: 30.0000 mL | Freq: Every day | ORAL | Status: DC | PRN

## 2017-06-29 MED ORDER — DICLOFENAC SODIUM 1 % TD GEL
4.0000 g | Freq: Three times a day (TID) | TRANSDERMAL | Status: DC | PRN
  Administered 2017-06-29 – 2017-07-01 (×2): 4 g via TOPICAL
  Filled 2017-06-29: qty 100

## 2017-06-29 MED ORDER — NICOTINE 21 MG/24HR TD PT24: 21.0000 mg | MEDICATED_PATCH | Freq: Every day | TRANSDERMAL | Status: DC

## 2017-06-29 MED ORDER — FLUOXETINE HCL 20 MG PO CAPS
40.0000 mg | ORAL_CAPSULE | Freq: Every day | ORAL | Status: DC
  Administered 2017-06-30 – 2017-07-04 (×5): 40 mg via ORAL
  Filled 2017-06-29 (×7): qty 2

## 2017-06-29 MED ORDER — HYDROXYZINE HCL 25 MG PO TABS
25.0000 mg | ORAL_TABLET | Freq: Three times a day (TID) | ORAL | Status: DC | PRN
  Administered 2017-06-30 (×3): 25 mg via ORAL
  Filled 2017-06-29 (×4): qty 1

## 2017-06-29 MED ORDER — GABAPENTIN 300 MG PO CAPS
300.0000 mg | ORAL_CAPSULE | Freq: Three times a day (TID) | ORAL | Status: DC
  Administered 2017-06-29 – 2017-07-04 (×14): 300 mg via ORAL
  Filled 2017-06-29 (×21): qty 1

## 2017-06-29 MED ORDER — PNEUMOCOCCAL VAC POLYVALENT 25 MCG/0.5ML IJ INJ: 0.5000 mL | INJECTION | INTRAMUSCULAR | Status: DC

## 2017-06-29 MED ORDER — TRAZODONE HCL 50 MG PO TABS
50.0000 mg | ORAL_TABLET | Freq: Every evening | ORAL | Status: DC | PRN
  Administered 2017-06-30: 50 mg via ORAL
  Filled 2017-06-29: qty 1

## 2017-06-29 NOTE — ED Triage Notes (Signed)
Pt dropped off by [name] from peer support. Pt reports thoughts of worthlessness and suicidal thoughts for several weeks worsening over last couple of day  Pt reports loss of mother and uncle. Pt has no suicidal plan. Reports that she has started using cocaine again. Pt wanded by security x 2
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
## 2017-06-29 NOTE — ED Notes (Signed)
Called [place] [employer] and made aware of patient that checked in for SI. Per registration, patient stated that she needed help because she felt like she may hurt herself. States patient then said she was going to step outside to smoke a cigarette because she knew once she got to the back she would not be allowed to smoke. Patient no where to be found inside or outside hospital. Patient left her bag in the waiting room. Security retrieved bag to be held for patient.

## 2017-06-29 NOTE — ED Provider Notes (Signed)
[facility]PT Provider Note   CSN: [id number] Arrival date & time: 06/29/17  [id number]     History   Chief Complaint Chief Complaint  Patient presents with  . Suicidal    HPI [name] is a 46 y.o. female.  HPI  Pt was seen at 1000. Per pt, c/o gradual onset and worsening of persistent depression for the past few weeks, worse over the past several days due to "a lot of things" including loss of mother and an uncle.  Has been associated with SI, no plan. Endorses using "cocaine again." Denies SA, no HI, no hallucinations.   Past Medical History:  Diagnosis Date  . Anxiety disorder   . Asthma   . Chronic pain syndrome   . Cocaine abuse   . Depression   . GERD (gastroesophageal reflux disease)   . Hepatitis    Hep C  . IBS (irritable bowel syndrome)   . Opiate addiction (HCC)   . S/P colonoscopy April 2011   rectal polyp, otherwise normal  . S/P endoscopy April 2011   erosive esophagitis, s/p Maloney dilation  . Schatzki's ring   . Schizophren[facility](HCC)     Patient Active Problem List   Diagnosis Date Noted  . IBS 03/28/2010  . ABDOMINAL PAIN 03/28/2010  . GERD 02/21/2010  . HEMATOCHEZIA 02/21/2010  . DYSPHAGIA UNSPECIFIED 02/21/2010  . DIARRHEA 02/21/2010  . SCHIZOPHRENIA 02/15/2010  . BIPOLAR DISORDER UNSPECIFIED 02/15/2010  . DEPRESSION/ANXIETY 02/15/2010  . MIGRAINE HEADACHE 02/15/2010    Past Surgical History:  Procedure Laterality Date  . ESOPHAGEAL DILATION N/A 03/31/2016   Procedure: ESOPHAGEAL DILATION;  Surgeon: Malissa HippoNajeeb U Rehman, MD;  [facility];  Service: Endoscopy;  Laterality: N/A;  . ESOPHAGOGASTRODUODENOSCOPY (EGD) WITH PROPOFOL N/A 03/31/2016   Procedure: ESOPHAGOGASTRODUODENOSCOPY (EGD) WITH PROPOFOL;  Surgeon: Malissa HippoNajeeb U Rehman, MD;  Location[facility];  Service: Endoscopy;  Laterality: N/A;  12:10  . JOINT REPLACEMENT     x3; last removed x1 year ago.  Marland Kitchen. TOTAL ABDOMINAL HYSTERECTOMY      OB History    No data  available       Home Medications    Prior to Admission medications   Medication Sig Start Date End Date Taking? Authorizing Provider  diclofenac sodium (VOLTAREN) 1 % GEL Apply 4 g topically 4 (four) times daily. 03/10/17   Palumbo, April, MD  dicyclomine (BENTYL) 10 MG capsule Take 1 capsule (10 mg total) by mouth 3 (three) times daily before meals. 03/31/16   Rehman, Joline MaxcyNajeeb U, MD  gabapentin (NEURONTIN) 300 MG capsule Take 300 mg by mouth 3 (three) times daily.    [provider]  HYDROcodone-acetaminophen (NORCO) 10-325 MG tablet Take 1 tablet by mouth every 6 (six) hours as needed for moderate pain.    [provider]  omeprazole (PRILOSEC) 40 MG capsule Take 1 capsule (40 mg total) by mouth 2 (two) times daily. Before breakfast and supper 03/16/16   Setzer, Brand Maleserri L, NP  Oxycodone HCl 10 MG TABS Take 1 tablet (10 mg total) by mouth 3 (three) times daily as needed (pain). 03/31/16   Rehman, Joline MaxcyNajeeb U, MD  promethazine (PHENERGAN) 25 MG tablet Take 1 tablet by mouth every 4 (four) hours as needed for nausea or vomiting.  02/03/16   [provider]    Family History Family History  Problem Relation Age of Onset  . Colon cancer Mother        Helmut MusterFrances Scott    Social History  Social History  Substance Use Topics  . Smoking status: Current Every Day Smoker    Packs/day: 1.50    Years: 26.00    Types: Cigarettes  . Smokeless tobacco: Never Used     Comment: 1 pack a day since age 46  . Alcohol use 7.2 oz/week    12 Cans of beer per week     Allergies   Asa [aspirin]; Food; Ibuprofen; and Propoxyphene n-acetaminophen   Review of Systems Review of Systems ROS: Statement: All systems negative except as marked or noted in the HPI; Constitutional: Negative for fever and chills. ; ; Eyes: Negative for eye pain, redness and discharge. ; ; ENMT: Negative for ear pain, hoarseness, nasal congestion, sinus pressure and sore throat. ; ; Cardiovascular: Negative for  chest pain, palpitations, diaphoresis, dyspnea and peripheral edema. ; ; Respiratory: Negative for cough, wheezing and stridor. ; ; Gastrointestinal: Negative for nausea, vomiting, diarrhea, abdominal pain, blood in stool, hematemesis, jaundice and rectal bleeding. . ; ; Genitourinary: Negative for dysuria, flank pain and hematuria. ; ; Musculoskeletal: Negative for back pain and neck pain. Negative for swelling and trauma.; ; Skin: Negative for pruritus, rash, abrasions, blisters, bruising and skin lesion.; ; Neuro: Negative for headache, lightheadedness and neck stiffness. Negative for weakness, altered level of consciousness, altered mental status, extremity weakness, paresthesias, involuntary movement, seizure and syncope.; Psych:  +depression, +SI, no SA, no HI, no hallucinations.       Physical Exam Updated Vital Signs BP (!) 112/58   Pulse 85   Temp 98.1 F (36.7 C) (Oral)   Resp 16   Wt 62.1 kg (137 lb)   SpO2 96%   BMI 22.80 kg/m   Physical Exam 1005: Physical examination:  Nursing notes reviewed; Vital signs and O2 SAT reviewed;  Constitutional: Well developed, Well nourished, Well hydrated, In no acute distress; Head:  Normocephalic, atraumatic; Eyes: EOMI, PERRL, No scleral icterus; ENMT: Mouth and pharynx normal, Mucous membranes moist; Neck: Supple, Full range of motion; Cardiovascular: Regular rate and rhythm; Respiratory: Breath sounds clear, No wheezes.  Speaking full sentences with ease, Normal respiratory effort/excursion; Chest: No deformity, Movement normal; Abdomen: Nondistended; Extremities: No deformity.; Neuro: AA&Ox3, Major CN grossly intact.  Speech clear. No gross focal motor deficits in extremities. Climbs on and off stretcher easily by herself. Gait steady.; Skin: Color normal, Warm, Dry.; Psych:  Affect flat.    ED Treatments / Results  Labs (all labs ordered are listed, but only abnormal results are displayed)   EKG  EKG Interpretation None        Radiology   Procedures Procedures (including critical care time)  Medications Ordered in ED Medications - No data to display   Initial Impression / Assessment and Plan / ED Course  I have reviewed the triage vital signs and the nursing notes.  Pertinent labs & imaging results that were available during my care of the patient were reviewed by me and considered in my medical decision making (see chart for details).  MDM Reviewed: previous chart, nursing note and vitals Reviewed previous: labs Interpretation: labs   Results for orders placed or performed during the hospital encounter of 06/29/17  Comprehensive metabolic panel  Result Value Ref Range   Sodium 142 135 - 145 mmol/L   Potassium 3.0 (L) 3.5 - 5.1 mmol/L   Chloride 106 101 - 111 mmol/L   CO2 28 22 - 32 mmol/L   Glucose, Bld 75 65 - 99 mg/dL   BUN 9 6 -  20 mg/dL   Creatinine, Ser 1.61 0.44 - 1.00 mg/dL   Calcium 9.4 8.9 - 09.6 mg/dL   Total Protein 7.2 6.5 - 8.1 g/dL   Albumin 4.3 3.5 - 5.0 g/dL   AST 27 15 - 41 U/L   ALT 20 14 - 54 U/L   Alkaline Phosphatase 60 38 - 126 U/L   Total Bilirubin 0.4 0.3 - 1.2 mg/dL   GFR calc non Af Amer >60 >60 mL/min   GFR calc Af Amer >60 >60 mL/min   Anion gap 8 5 - 15  Ethanol  Result Value Ref Range   Alcohol, Ethyl (B) <5 <5 mg/dL  Salicylate level  Result Value Ref Range   Salicylate Lvl <7.0 2.8 - 30.0 mg/dL  Acetaminophen level  Result Value Ref Range   Acetaminophen (Tylenol), Serum <10 (L) 10 - 30 ug/mL  cbc  Result Value Ref Range   WBC 9.6 4.0 - 10.5 K/uL   RBC 4.16 3.87 - 5.11 MIL/uL   Hemoglobin 13.5 12.0 - 15.0 g/dL   HCT 04.5 40.9 - 81.1 %   MCV 94.5 78.0 - 100.0 fL   MCH 32.5 26.0 - 34.0 pg   MCHC 34.4 30.0 - 36.0 g/dL   RDW 91.4 78.2 - 95.6 %   Platelets 284 150 - 400 K/uL  Rapid urine drug screen (hospital performed)  Result Value Ref Range   Opiates NONE DETECTED NONE DETECTED   Cocaine POSITIVE (A) NONE DETECTED   Benzodiazepines  POSITIVE (A) NONE DETECTED   Amphetamines NONE DETECTED NONE DETECTED   Tetrahydrocannabinol POSITIVE (A) NONE DETECTED   Barbiturates NONE DETECTED NONE DETECTED    1120:  Potassium repleted PO. Will have TTS evaluate.   1220:  Placement pending. Holding orders written.   1425:  Pt accepted to [facility]. Will transfer stable.   Final Clinical Impressions(s) / ED Diagnoses   Final diagnoses:  None    New Prescriptions New Prescriptions   No medications on file     Samuel Jester, DO 06/29/17 1429

## 2017-06-29 NOTE — ED Notes (Signed)
[facility] called. Pt meets inpt criteria

## 2017-06-29 NOTE — Progress Notes (Signed)
[name] is a 46 year old female being admitted voluntarily to 306-2 from [facility].  She came tot he ED with suicidal ideation and substance abuse.  She is currently homeless and no family supports.  She reported recent stressor are his uncle passed away May 2018 and her mother passed away a year ago.  She has been homeless since her brother made her leave her mother's house a month after her death.  She has been using cocaine and marijuana since being off her psychiatric medications for the past three months.  She has not been able to follow up with her OP psychiatric provider due to transportation issues.  She denies A/V hallucinations.  She continues to voice suicidal ideation but is able to contract for safety on the unit.  She denies any pain or discomfort and appears to be in no physical distress.  Oriented her to the unit.  Admission paperwork completed and signed.  Belongings searched and secured in locker # 42.  Skin assessment completed and no skin issues noted.  Q 15 minute checks initiated for safety.  We will monitor the progress towards her goals.

## 2017-06-29 NOTE — ED Notes (Signed)
Called patient to triage. No answer.

## 2017-06-29 NOTE — Tx Team (Signed)
Initial Treatment Plan 06/29/2017 4:25 PM [name] [id number]    PATIENT STRESSORS: Financial difficulties Loss of Uncle died in May 2018-mother died on year ago Marital or family conflict Substance abuse   PATIENT STRENGTHS: Wellsite geologistCommunication skills General fund of knowledge Motivation for treatment/growth Physical Health   PATIENT IDENTIFIED PROBLEMS: Depression  Suicidal ideation  Substance abuse  "Getting off of drugs"  "Get on my mental health medications"             DISCHARGE CRITERIA:  Improved stabilization in mood, thinking, and/or behavior Verbal commitment to aftercare and medication compliance Withdrawal symptoms are absent or subacute and managed without 24-hour nursing intervention  PRELIMINARY DISCHARGE PLAN: Outpatient therapy Medication management  PATIENT/FAMILY INVOLVEMENT: This treatment plan has been presented to and reviewed with the patient, [name].  The patient and family have been given the opportunity to ask questions and make suggestions.  Jocelyn BaconHeather V Cherree Conerly, RN 06/29/2017, 4:25 PM

## 2017-06-29 NOTE — ED Notes (Signed)
Walked through parking lot. Pt unable to be located

## 2017-06-29 NOTE — BH Assessment (Signed)
[facility] Assessment Progress Note Case was staffed with Lu Duffel NP who stated patient met inpatient criteria as appropriate bed placement is investigated.

## 2017-06-29 NOTE — Progress Notes (Signed)
[facility] , [facility] ; Accepting provider is Ferne ReusJustina Okonkwo, NP; Attending provider is Dr. Jama Flavorsobos.   Patient can arrive at 3:00pm. Number for report is [phone].   Donnelly Angelicahristy Hyatt, RN notified.    Baldo DaubJolan Ercell Perlman MSW, LCSWA CSW Disposition [phone]

## 2017-06-30 DIAGNOSIS — F1220 Cannabis dependence, uncomplicated: Secondary | ICD-10-CM | POA: Clinically undetermined

## 2017-06-30 DIAGNOSIS — F333 Major depressive disorder, recurrent, severe with psychotic symptoms: Secondary | ICD-10-CM | POA: Diagnosis present

## 2017-06-30 DIAGNOSIS — F1310 Sedative, hypnotic or anxiolytic abuse, uncomplicated: Secondary | ICD-10-CM | POA: Clinically undetermined

## 2017-06-30 DIAGNOSIS — F1910 Other psychoactive substance abuse, uncomplicated: Secondary | ICD-10-CM

## 2017-06-30 DIAGNOSIS — M791 Myalgia: Secondary | ICD-10-CM

## 2017-06-30 DIAGNOSIS — Z62810 Personal history of physical and sexual abuse in childhood: Secondary | ICD-10-CM

## 2017-06-30 DIAGNOSIS — F1420 Cocaine dependence, uncomplicated: Secondary | ICD-10-CM | POA: Clinically undetermined

## 2017-06-30 DIAGNOSIS — G4700 Insomnia, unspecified: Secondary | ICD-10-CM

## 2017-06-30 LAB — BASIC METABOLIC PANEL
Anion gap: 6 (ref 5–15)
BUN: 17 mg/dL (ref 6–20)
CALCIUM: 8.9 mg/dL (ref 8.9–10.3)
CHLORIDE: 110 mmol/L (ref 101–111)
CO2: 25 mmol/L (ref 22–32)
CREATININE: 0.97 mg/dL (ref 0.44–1.00)
GFR calc Af Amer: >60 mL/min (ref >60)
Glucose, Bld: 85 mg/dL (ref 65–99)
Potassium: 3.6 mmol/L (ref 3.5–5.1)
SODIUM: 141 mmol/L (ref 135–145)

## 2017-06-30 LAB — CBC
HCT: 36.8 % (ref 36.0–46.0)
HEMOGLOBIN: 12.2 g/dL (ref 12.0–15.0)
MCH: 31.8 pg (ref 26.0–34.0)
MCHC: 33.2 g/dL (ref 30.0–36.0)
MCV: 95.8 fL (ref 78.0–100.0)
PLATELETS: 243 10*3/uL (ref 150–400)
RBC: 3.84 MIL/uL — ABNORMAL LOW (ref 3.87–5.11)
RDW: 12.9 % (ref 11.5–15.5)
WBC: 9.6 10*3/uL (ref 4.0–10.5)

## 2017-06-30 MED ORDER — QUETIAPINE FUMARATE 300 MG PO TABS
300.0000 mg | ORAL_TABLET | Freq: Every day | ORAL | Status: DC
  Administered 2017-06-30: 300 mg via ORAL
  Filled 2017-06-30 (×4): qty 1

## 2017-06-30 MED ORDER — DICLOFENAC SODIUM 50 MG PO TBEC
50.0000 mg | DELAYED_RELEASE_TABLET | Freq: Three times a day (TID) | ORAL | Status: DC
  Administered 2017-06-30 – 2017-07-02 (×8): 50 mg via ORAL
  Filled 2017-06-30 (×12): qty 1

## 2017-06-30 NOTE — [facility] Suicide Risk Assessment (Signed)
[facility]   Nursing information obtained from:  Patient Demographic factors:  Gay, lesbian, or bisexual orientation, Caucasian, Low socioeconomic status Current Mental Status:  Suicidal ideation indicated by patient Loss Factors:  Financial problems / change in socioeconomic status, Loss of significant relationship Historical Factors:  Prior suicide attempts, Family history of mental illness or substance abuse Risk Reduction Factors:  NA  Total Time spent with patient: 30 minutes Principal Problem: Major depressive disorder, recurrent episode, severe, with psychosis (HCC) Diagnosis:   Patient Active Problem List   Diagnosis Date Noted  . Major depressive disorder, recurrent episode, severe, with psychosis (HCC) [F33.3] 06/30/2017  . Sedative, hypnotic or anxiolytic use disorder, mild, abuse [F13.10] 06/30/2017  . Cocaine use disorder, moderate, dependence (HCC) [F14.20] 06/30/2017  . Cannabis use disorder, moderate, dependence (HCC) [F12.20] 06/30/2017  . IBS [K58.9] 03/28/2010  . ABDOMINAL PAIN [R10.9] 03/28/2010  . GERD [K21.9] 02/21/2010  . HEMATOCHEZIA [K92.1] 02/21/2010  . DYSPHAGIA UNSPECIFIED [R13.10] 02/21/2010  . DIARRHEA [R19.7] 02/21/2010  . BIPOLAR DISORDER UNSPECIFIED [F31.9] 02/15/2010  . DEPRESSION/ANXIETY [F34.1] 02/15/2010  . MIGRAINE HEADACHE [G43.909] 02/15/2010   Subjective Data: Pt with depression, anxiety, restlesness, presented with SI , polysubstance abuse. She also has been using xanax on and off from a friend,denies withdrawal sx. Pt reports she wants to get help and is motivated to go to a substance abuse program.  Continued Clinical Symptoms:  Alcohol Use Disorder Identification Test Final Score (AUDIT): 4 The "Alcohol Use Disorders Identification Test", Guidelines for Use in Primary Care, Second Edition.  World Science writerHealth Organization [facility](WHO). Score between 0-7:  no or low risk or alcohol related problems. Score between 8-15:   moderate risk of alcohol related problems. Score between 16-19:  high risk of alcohol related problems. Score 20 or above:  warrants further diagnostic evaluation for alcohol dependence and treatment.   CLINICAL FACTORS:   Depression:   Comorbid alcohol abuse/dependence Hopelessness Impulsivity Alcohol/Substance Abuse/Dependencies Previous Psychiatric Diagnoses and Treatments   Musculoskeletal: Strength & Muscle Tone: within normal limits Gait & Station: normal Patient leans: N/A  Psychiatric Specialty Exam: Physical Exam  Review of Systems  Psychiatric/Behavioral: Positive for depression, hallucinations, substance abuse and suicidal ideas. The patient is nervous/anxious and has insomnia.   All other systems reviewed and are negative.   Blood pressure (!) 103/50, pulse 90, temperature 99.8 F (37.7 C), temperature source Oral, resp. rate 18, height 5\' 5"  (1.651 m), weight 62.1 kg (137 lb), SpO2 100 %.Body mass index is 22.8 kg/m.  General Appearance: Guarded  Eye Contact:  Fair  Speech:  Normal Rate  Volume:  Normal  Mood:  Anxious and Depressed  Affect:  Appropriate  Thought Process:  Goal Directed and Descriptions of Associations: Circumstantial  Orientation:  Full (Time, Place, and Person)  Thought Content:  Hallucinations: Auditory  Suicidal Thoughts:  Yes.  without intent/plan  Homicidal Thoughts:  No  Memory:  Immediate;   Fair Recent;   Fair Remote;   Fair  Judgement:  Impaired  Insight:  Shallow  Psychomotor Activity:  Normal  Concentration:  Concentration: Fair and Attention Span: Fair  Recall:  FiservFair  Fund of Knowledge:  Fair  Language:  Fair  Akathisia:  No  Handed:  Right  AIMS (if indicated):     Assets:  Communication Skills Desire for Improvement  ADL's:  Intact  Cognition:  WNL  Sleep:  Number of Hours: 6.75      COGNITIVE FEATURES THAT CONTRIBUTE TO RISK:  Closed-mindedness, Polarized  thinking and Thought constriction (tunnel vision)     SUICIDE RISK:   Moderate:  Frequent suicidal ideation with limited intensity, and duration, some specificity in terms of plans, no associated intent, good self-control, limited dysphoria/symptomatology, some risk factors present, and identifiable protective factors, including available and accessible social support.  PLAN OF CARE: Case discussed with NP. Restart Seroquel at 300 mg po qhs , she has been on a higher dosage in the past , restart Prozac , neurontin. Denies withdrawal sx from BZD . Will refer to a substance abuse treatment program.  I certify that inpatient services furnished can reasonably be expected to improve the patient's condition.   Britlyn Martine, MD 06/30/2017, 12:45 PM
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
## 2017-06-30 NOTE — Progress Notes (Signed)
Pt attend wrap up group. Her day was a 3 to a 4. Her goal get medication under control. She's sleeping a lot to get her body back on track.

## 2017-06-30 NOTE — Progress Notes (Signed)
Writer spoke with patient 1:1 and she reports that she just got here earlier today and is getting adjusted to the unit. Writer informed her of the schedule for the remainder of the night and first thing in the morning. She was informed of her medications and she requested a dose of Seroquel for bedtime reporting that trazadone dose not help her to rest. She received this medication along with voltaren gel for shoulder pain. Support given and safety maintained on unit with 15 min checks.

## 2017-06-30 NOTE — [facility] Group Notes (Signed)
[facility] LCSW Group Therapy Note  06/23/2017 10:15 to 11:15 AM  Type of Therapy and Topic:  Group Therapy: Avoiding Self-Sabotaging and Enabling Behaviors  Participation Level:  Did Not Attend; invited to participate yet did not despite overhead announcement and encouragement by CSW who went into patient's room before group began.  Description of Group The main focus of today's process group to discuss what "self-sabotage" means and use motivational iInterviewing to discuss what benefits, negative or positive, were involved in a self-identified self-sabotaging behavior. We then talked about reasons the patient may want to change the behavior and their current desire to change.   Therapeutic molalities: Cognitive Behavioral Therapy Person-Centered Therapy Motivational Interviewing  Therapeutic Goals: 1. Patients will demonstrate understanding of the concept of self sabotage 2. Patients will be able to identify pros and cons of their behaviors 3. Patients will be able to identify at least two motivating factors for l of their desire for change   Catherine C Harrill, LCSW  

## 2017-06-30 NOTE — Progress Notes (Signed)
[facility] 7a-7p shift:   D:  Pt. Has been irritable this shift.  She is focused on somatic complaints and anxiety because she is not able to take percocet as she normally would.  She is avoidant of groups, and is not showing signs of being vested in treatment despite encouragement from staff to participate.     A: Support, education, and encouragement provided as needed.  Level 3 checks continued for safety.  R: Pt. minimally receptive to intervention/s.  Safety maintained.  Joaquin MusicMary Rayfield Beem, RN
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
## 2017-06-30 NOTE — [facility] Suicide Risk Assessment (Signed)
[facility] INPATIENT:  Family/Significant Other Suicide Prevention Education  Suicide Prevention Education:  Patient Refusal for Family/Significant Other Suicide Prevention Education: The patient [name] has refused to provide written consent for family/significant other to be provided Family/Significant Other Suicide Prevention Education during admission and/or prior to discharge.  Physician notified.  Carloyn JaegerMareida J Grossman-Orr 06/30/2017, 2:25 PM

## 2017-07-01 MED ORDER — NICOTINE 21 MG/24HR TD PT24
21.0000 mg | MEDICATED_PATCH | Freq: Every day | TRANSDERMAL | Status: DC
  Administered 2017-07-01 – 2017-07-04 (×4): 21 mg via TRANSDERMAL
  Filled 2017-07-01 (×8): qty 1

## 2017-07-01 MED ORDER — NICOTINE 21 MG/24HR TD PT24
MEDICATED_PATCH | TRANSDERMAL | Status: AC
  Administered 2017-07-01: 21 mg
  Filled 2017-07-01: qty 1

## 2017-07-01 MED ORDER — QUETIAPINE FUMARATE 400 MG PO TABS
400.0000 mg | ORAL_TABLET | Freq: Every day | ORAL | Status: DC
  Administered 2017-07-01 – 2017-07-02 (×2): 400 mg via ORAL
  Filled 2017-07-01 (×4): qty 1

## 2017-07-01 MED ORDER — TRAZODONE HCL 100 MG PO TABS
100.0000 mg | ORAL_TABLET | Freq: Every evening | ORAL | Status: DC | PRN
  Administered 2017-07-01: 100 mg via ORAL
  Filled 2017-07-01: qty 1

## 2017-07-01 MED ORDER — HYDROXYZINE HCL 50 MG PO TABS
50.0000 mg | ORAL_TABLET | Freq: Three times a day (TID) | ORAL | Status: DC | PRN
  Administered 2017-07-01 – 2017-07-04 (×7): 50 mg via ORAL
  Filled 2017-07-01 (×8): qty 1

## 2017-07-01 MED ORDER — ONDANSETRON 8 MG PO TBDP
8.0000 mg | ORAL_TABLET | Freq: Once | ORAL | Status: AC
  Administered 2017-07-01: 8 mg via ORAL
  Filled 2017-07-01: qty 2
  Filled 2017-07-01: qty 1

## 2017-07-01 NOTE — Progress Notes (Signed)
Patient has been up in the dayroom briefly. She attended group this evening and participated. She and her roommate have been getting to know each other since both were admitted on yesterday. Patient currently denies having pain, -si/hi/a/v hall. Support and encouragement offered, safety maintained on unit, will continue to monitor.

## 2017-07-01 NOTE — Progress Notes (Signed)
[facility] MD Progress Note  07/01/2017 5:28 PM [name]  MRN:  [id number]   Subjective:  Patient presents in the hallway and walks to her room. She states that her only complaint is her right shoulder, but it was hurting before she came in due to a past surgery. She Denies any SI/HI but admits to AVH and states that the [facility] are shadowy figures but she can ignore those and her AH she can get to go away by covering her eyes. She denies the [facility] are command. She also states that she is having difficulty sleeping and asked to have her Trazodone increased. Patient also request long term treatment after discharge. She reports some elevated anxiety and I agree to increase her Vistaril to 50 mg.   Objective: Patient has been cooperative and attending groups. Will increase her Seroquel to 400 mg due to her previously being on 900 mg Daily. Trazodone will be increased to 100 mg and she is pleased with these changes. Will discuss with SW staff options for long term treatment. Of note her potassium was at 3.0 on arrival and a recheck lab had her potassium level at 3.6.  Principal Problem: Major depressive disorder, recurrent episode, severe, with psychosis (HCC) Diagnosis:   Patient Active Problem List   Diagnosis Date Noted  . Major depressive disorder, recurrent episode, severe, with psychosis (HCC) [F33.3] 06/30/2017  . Sedative, hypnotic or anxiolytic use disorder, mild, abuse [F13.10] 06/30/2017  . Cocaine use disorder, moderate, dependence (HCC) [F14.20] 06/30/2017  . Cannabis use disorder, moderate, dependence (HCC) [F12.20] 06/30/2017  . IBS [K58.9] 03/28/2010  . ABDOMINAL PAIN [R10.9] 03/28/2010  . GERD [K21.9] 02/21/2010  . HEMATOCHEZIA [K92.1] 02/21/2010  . DYSPHAGIA UNSPECIFIED [R13.10] 02/21/2010  . DIARRHEA [R19.7] 02/21/2010  . BIPOLAR DISORDER UNSPECIFIED [F31.9] 02/15/2010  . DEPRESSION/ANXIETY [F34.1] 02/15/2010  . MIGRAINE HEADACHE [G43.909] 02/15/2010   Total Time spent with patient:  25 minutes  Past Psychiatric History: See H&P  Past Medical History:  Past Medical History:  Diagnosis Date  . Anxiety disorder   . Asthma   . Chronic pain syndrome   . Cocaine abuse   . Depression   . GERD (gastroesophageal reflux disease)   . Hepatitis    Hep C  . IBS (irritable bowel syndrome)   . Opiate addiction (HCC)   . S/P colonoscopy April 2011   rectal polyp, otherwise normal  . S/P endoscopy April 2011   erosive esophagitis, s/p Maloney dilation  . Schatzki's ring   . Schizophrenia [facility])     Past Surgical History:  Procedure Laterality Date  . ESOPHAGEAL DILATION N/A 03/31/2016   Procedure: ESOPHAGEAL DILATION;  Surgeon: Malissa Hippo, MD;  Location: [facility];  Service: Endoscopy;  Laterality: N/A;  . ESOPHAGOGASTRODUODENOSCOPY (EGD) WITH PROPOFOL N/A 03/31/2016   Procedure: ESOPHAGOGASTRODUODENOSCOPY (EGD) WITH PROPOFOL;  Surgeon: Malissa Hippo, MD;  Location: [facility];  Service: Endoscopy;  Laterality: N/A;  12:10  . JOINT REPLACEMENT     x3; last removed x1 year ago.  Marland Kitchen TOTAL ABDOMINAL HYSTERECTOMY     Family History:  Family History  Problem Relation Age of Onset  . Colon cancer Mother        [name]   Family Psychiatric  History: See H&P Social History:  History  Alcohol Use  . 7.2 oz/week  . 12 Cans of beer per week     History  Drug Use  . Types: Other-see comments, Cocaine, Marijuana    Comment: xanax  Social History   Social History  . Marital status: Single    Spouse name: N/A  . Number of children: N/A  . Years of education: N/A   Social History Main Topics  . Smoking status: Current Every Day Smoker    Packs/day: 1.50    Years: 26.00    Types: Cigarettes  . Smokeless tobacco: Never Used     Comment: 1 pack a day since age 39  . Alcohol use 7.2 oz/week    12 Cans of beer per week  . Drug use: Yes    Types: Other-see comments, Cocaine, Marijuana     Comment: xanax  . Sexual activity: No   Other Topics  Concern  . None   Social History Narrative  . None   Additional Social History:                         Sleep: Fair  Appetite:  Good  Current Medications: Current Facility-Administered Medications  Medication Dose Route Frequency Provider Last Rate Last Dose  . alum & mag hydroxide-simeth (MAALOX/MYLANTA) 200-200-20 MG/5ML suspension 30 mL  30 mL Oral Q4H PRN Okonkwo, Justina A, NP      . diclofenac (VOLTAREN) EC tablet 50 mg  50 mg Oral TID Money, Gerlene Burdock, FNP   50 mg at 07/01/17 1641  . diclofenac sodium (VOLTAREN) 1 % transdermal gel 4 g  4 g Topical TID PRN Nira Conn A, NP   4 g at 07/01/17 0821  . FLUoxetine (PROZAC) capsule 40 mg  40 mg Oral Daily Okonkwo, Justina A, NP   40 mg at 07/01/17 0821  . gabapentin (NEURONTIN) capsule 300 mg  300 mg Oral TID Beryle Lathe, Justina A, NP   300 mg at 07/01/17 1641  . hydrOXYzine (ATARAX/VISTARIL) tablet 50 mg  50 mg Oral TID PRN Money, Gerlene Burdock, FNP   50 mg at 07/01/17 1110  . magnesium hydroxide (MILK OF MAGNESIA) suspension 30 mL  30 mL Oral Daily PRN Okonkwo, Justina A, NP      . nicotine (NICODERM CQ - dosed in mg/24 hours) patch 21 mg  21 mg Transdermal Q0600 Cobos, Rockey Situ, MD   21 mg at 07/01/17 0840  . pantoprazole (PROTONIX) EC tablet 40 mg  40 mg Oral Daily Okonkwo, Justina A, NP   40 mg at 07/01/17 0821  . pneumococcal 23 valent vaccine (PNU-IMMUNE) injection 0.5 mL  0.5 mL Intramuscular Tomorrow-1000 Cobos, Rockey Situ, MD   Stopped at 06/30/17 [phone]  . QUEtiapine (SEROQUEL) tablet 400 mg  400 mg Oral QHS Money, Gerlene Burdock, FNP      . traZODone (DESYREL) tablet 100 mg  100 mg Oral QHS PRN Money, Gerlene Burdock, FNP        Lab Results:  Results for orders placed or performed during the hospital encounter of 06/29/17 (from the past 48 hour(s))  CBC     Status: Abnormal   Collection Time: 06/30/17  6:34 PM  Result Value Ref Range   WBC 9.6 4.0 - 10.5 K/uL   RBC 3.84 (L) 3.87 - 5.11 MIL/uL   Hemoglobin 12.2 12.0 - 15.0 g/dL    HCT 00.5 65.4 - 33.9 %   MCV 95.8 78.0 - 100.0 fL   MCH 31.8 26.0 - 34.0 pg   MCHC 33.2 30.0 - 36.0 g/dL   RDW 47.9 84.8 - 21.4 %   Platelets 243 150 - 400 K/uL    Comment: Performed at [facility], [place] [address], [place], [place] [address]  Basic metabolic panel     Status: None   Collection Time: 06/30/17  6:34 PM  Result Value Ref Range   Sodium 141 135 - 145 mmol/L   Potassium 3.6 3.5 - 5.1 mmol/L   Chloride 110 101 - 111 mmol/L   CO2 25 22 - 32 mmol/L   Glucose, Bld 85 65 - 99 mg/dL   BUN 17 6 - 20 mg/dL   Creatinine, Ser 0.97 0.44 - 1.00 mg/dL   Calcium 8.9 8.9 - 10.3 mg/dL   GFR calc non Af Amer >60 >60 mL/min   GFR calc Af Amer >60 >60 mL/min    Comment: (NOTE) The eGFR has been calculated using the CKD EPI equation. This calculation has not been validated in all clinical situations. eGFR's persistently <60 mL/min signify possible Chronic Kidney Disease.    Anion gap 6 5 - 15    Comment: Performed at [facility], [place] [address] [place], [place] [address]    Blood Alcohol level:  Lab Results  Component Value Date   [facility] <5 06/29/2017   ETH <11 09/32/3557    Metabolic Disorder Labs: No results found for: HGBA1C, MPG No results found for: PROLACTIN No results found for: CHOL, TRIG, HDL, CHOLHDL, VLDL, LDLCALC  Physical Findings: AIMS: Facial and Oral Movements Muscles of Facial Expression: None, normal Lips and Perioral Area: None, normal Jaw: None, normal Tongue: None, normal,Extremity Movements Upper (arms, wrists, hands, fingers): None, normal Lower (legs, knees, ankles, toes): None, normal, Trunk Movements Neck, shoulders, hips: None, normal, Overall Severity Severity of abnormal movements (highest score from questions above): None, normal Incapacitation due to abnormal movements: None, normal Patient's awareness of abnormal movements (rate only patient's report): No Awareness, Dental Status Current problems  with teeth and/or dentures?: No Does patient usually wear dentures?: No  CIWA:  CIWA-Ar Total: 0 COWS:     Musculoskeletal: Strength & Muscle Tone: within normal limits Gait & Station: normal Patient leans: N/A  Psychiatric Specialty Exam: Physical Exam  Nursing note and vitals reviewed. Constitutional: She is oriented to person, place, and time. She appears well-developed and well-nourished.  Cardiovascular: Normal rate.   Respiratory: Effort normal.  Musculoskeletal: Normal range of motion.  Neurological: She is alert and oriented to person, place, and time.  Skin: Skin is warm.    Review of Systems  Constitutional: Negative.   HENT: Negative.   Eyes: Negative.   Respiratory: Negative.   Cardiovascular: Negative.   Gastrointestinal: Negative.   Genitourinary: Negative.   Musculoskeletal: Positive for joint pain.  Skin: Negative.   Neurological: Negative.   Endo/Heme/Allergies: Negative.     Blood pressure 102/70, pulse 80, temperature 98.6 F (37 C), temperature source Oral, resp. rate 16, height '5\' 5"'$  (1.651 m), weight 62.1 kg (137 lb), SpO2 100 %.Body mass index is 22.8 kg/m.  General Appearance: Casual  Eye Contact:  Good  Speech:  Clear and Coherent and Normal Rate  Volume:  Normal  Mood:  Depressed  Affect:  Congruent  Thought Process:  Coherent and Descriptions of Associations: Intact  Orientation:  Full (Time, Place, and Person)  Thought Content:  WDL and Hallucinations: Auditory Visual  Suicidal Thoughts:  No  Homicidal Thoughts:  No  Memory:  Immediate;   Good Recent;   Good  Judgement:  Good  Insight:  Good  Psychomotor Activity:  Normal  Concentration:  Concentration: Good  Recall:  Good  Fund of Knowledge:  Good  Language:  Good  Akathisia:  No  Handed:  Right  AIMS (if indicated):     Assets:  Communication Skills Desire for Improvement Social Support  ADL's:  Intact  Cognition:  WNL  Sleep:  Number of Hours: 6.75     Treatment Plan  Summary: Daily contact with patient to assess and evaluate symptoms and progress in treatment, Medication management and Plan is to:  -Increase Seroquel to 400 mg PO QHS for mood stability -Increase Trazodone 100 mg PO QHS for insomnia -Continue Hydroxyzine 50 mg PO TID PRN for anxiety -Continue Gabapentin 300 mg PO TID  -Continue Prozac 40 mg PO Daily for mood stability -Encourage group therapy participation  Lewis Shock, FNP 07/01/2017, 5:28 PM

## 2017-07-01 NOTE — Progress Notes (Signed)
  D) Pt rates her depression at an 8, hopelessness at a 9 and anxiety at a 10.  Denies SI and HI. Pt has been attending the groups and is interacting with her peers appropriately. States, "I just want to get off the drugs and start on the medications that are going to help me. This has been going on too long. I just need some medications that will stabilize me. A) Given support, reassurance and praise.Encouragment given. Provided with a 1:1 R) Denies SI and HI.

## 2017-07-01 NOTE — [facility] Group Notes (Signed)
Adult Psychoeducational Group Note  Date:  07/01/2017 Time:  2:37 PM  Group Topic/Focus: Relaxation Group   Participation Level:did not Partisipated  Participation Quality: poorl participation   Affect:  appropriate  Cognitive:  appropriate  Insight: lacked insight  Engagement in Group: Did not engage  Modes of Intervention:  Asking Pt the same questions different ways to get her to engage  Additional Comments:  Pt sat there and said that she didn't know what to say.  Dione HousekeeperJudge, Ilma Achee A 07/01/2017, 2:37 PM

## 2017-07-01 NOTE — Progress Notes (Signed)
Patient did attend the evening speaker AA meeting.  

## 2017-07-01 NOTE — Progress Notes (Signed)
Writer spoke with patient 1:1 and she c/o feeling nauseated requesting medication. She received gingerale to see if this would help but no relief. She was informed of dosage changes on two medications and she was aware of this. She reported to Clinical research associatewriter that she and her roommate know each other and are from the same area. She reports that her goal is to go to a 21 day tx program and has mentioned this to her Child psychotherapistsocial worker. Writer will obtain an order for her nausea and monitor effectiveness of medication. Safety maintained on unit with 15 min checks.

## 2017-07-01 NOTE — [facility] Group Notes (Signed)
[facility] LCSW Group Therapy Note   07/01/2017  At 10:15 to 11:10 AM   Type of Therapy and Topic: Group Therapy: Feelings Around Returning Home & Establishing a Supportive Framework and Developing a Healthier Routine  Participation Level:  Active   Description of Group:  Patients first processed thoughts and feelings about up coming discharge. These included fears of upcoming changes, lack of change, new living environments, judgements and expectations from others and overall stigma of MH issues. We then discussed what is a supportive framework? What does it look like feel like and how do I discern it from and unhealthy non-supportive network? Learn how to cope when supports are not helpful and don't support you. Discuss what to do when your family/friends are not supportive.   Therapeutic Goals Addressed in Processing Group:  1. Patient will identify one healthy supportive network that they can use at discharge. 2. Patient will identify one factor of a supportive framework and how to tell it from an unhealthy network. 3. Patient able to identify one coping skill to use when they do not have positive supports from others. 4. Patient will demonstrate ability to communicate their needs through discussion and/or role plays.  Summary of Patient Progress:  Pt engaged easily while present and processed her feelings about family relationships. Patient appeared somewhat anxious as she was in and out of group room frequently.     Carney Bernatherine C Harrill, LCSW

## 2017-07-02 DIAGNOSIS — F1310 Sedative, hypnotic or anxiolytic abuse, uncomplicated: Secondary | ICD-10-CM

## 2017-07-02 DIAGNOSIS — F333 Major depressive disorder, recurrent, severe with psychotic symptoms: Principal | ICD-10-CM

## 2017-07-02 DIAGNOSIS — F39 Unspecified mood [affective] disorder: Secondary | ICD-10-CM

## 2017-07-02 DIAGNOSIS — F17210 Nicotine dependence, cigarettes, uncomplicated: Secondary | ICD-10-CM

## 2017-07-02 DIAGNOSIS — M2550 Pain in unspecified joint: Secondary | ICD-10-CM

## 2017-07-02 DIAGNOSIS — F1420 Cocaine dependence, uncomplicated: Secondary | ICD-10-CM

## 2017-07-02 DIAGNOSIS — F1220 Cannabis dependence, uncomplicated: Secondary | ICD-10-CM

## 2017-07-02 DIAGNOSIS — F419 Anxiety disorder, unspecified: Secondary | ICD-10-CM

## 2017-07-02 MED ORDER — IBUPROFEN 400 MG PO TABS
400.0000 mg | ORAL_TABLET | Freq: Four times a day (QID) | ORAL | Status: DC | PRN
  Administered 2017-07-02 – 2017-07-03 (×2): 400 mg via ORAL
  Filled 2017-07-02 (×2): qty 1

## 2017-07-02 MED ORDER — DICLOFENAC SODIUM 50 MG PO TBEC: 50.0000 mg | DELAYED_RELEASE_TABLET | Freq: Three times a day (TID) | ORAL | Status: DC | PRN

## 2017-07-02 NOTE — Tx Team (Signed)
Interdisciplinary Treatment and Diagnostic Plan Update 07/02/2017 Time of Session: 9:30am  [name]  MRN: [id number]  Principal Diagnosis: Major depressive disorder, recurrent episode, severe, with psychosis (HCC)  Secondary Diagnoses: Principal Problem:   Major depressive disorder, recurrent episode, severe, with psychosis (HCC) Active Problems:   Sedative, hypnotic or anxiolytic use disorder, mild, abuse   Cocaine use disorder, moderate, dependence (HCC)   Cannabis use disorder, moderate, dependence (HCC)   Current Medications:  Current Facility-Administered Medications  Medication Dose Route Frequency Provider Last Rate Last Dose  . alum & mag hydroxide-simeth (MAALOX/MYLANTA) 200-200-20 MG/5ML suspension 30 mL  30 mL Oral Q4H PRN Okonkwo, Justina A, NP      . diclofenac (VOLTAREN) EC tablet 50 mg  50 mg Oral TID Money, Gerlene Burdock, FNP   50 mg at 07/02/17 4098  . diclofenac sodium (VOLTAREN) 1 % transdermal gel 4 g  4 g Topical TID PRN Nira Conn A, NP   4 g at 07/01/17 0821  . FLUoxetine (PROZAC) capsule 40 mg  40 mg Oral Daily Okonkwo, Justina A, NP   40 mg at 07/02/17 1191  . gabapentin (NEURONTIN) capsule 300 mg  300 mg Oral TID Beryle Lathe, Justina A, NP   300 mg at 07/02/17 4782  . hydrOXYzine (ATARAX/VISTARIL) tablet 50 mg  50 mg Oral TID PRN Money, Gerlene Burdock, FNP   50 mg at 07/01/17 2118  . magnesium hydroxide (MILK OF MAGNESIA) suspension 30 mL  30 mL Oral Daily PRN Okonkwo, Justina A, NP      . nicotine (NICODERM CQ - dosed in mg/24 hours) patch 21 mg  21 mg Transdermal Q0600 Cobos, Rockey Situ, MD   21 mg at 07/02/17 0800  . pantoprazole (PROTONIX) EC tablet 40 mg  40 mg Oral Daily Okonkwo, Justina A, NP   40 mg at 07/02/17 9562  . pneumococcal 23 valent vaccine (PNU-IMMUNE) injection 0.5 mL  0.5 mL Intramuscular Tomorrow-1000 Cobos, Rockey Situ, MD   Stopped at 06/30/17 [phone]  . QUEtiapine (SEROQUEL) tablet 400 mg  400 mg Oral QHS Money, Gerlene Burdock, FNP   400 mg at 07/01/17  2118  . traZODone (DESYREL) tablet 100 mg  100 mg Oral QHS PRN Money, Gerlene Burdock, FNP   100 mg at 07/01/17 2118    PTA Medications: Prescriptions Prior to Admission  Medication Sig Dispense Refill Last Dose  . ALPRAZolam (XANAX) 1 MG tablet Take 1 mg by mouth 3 (three) times daily as needed for anxiety.   Past Week at Unknown time  . diclofenac sodium (VOLTAREN) 1 % GEL Apply 4 g topically 4 (four) times daily. 100 g 0 Past Week at Unknown time  . dicyclomine (BENTYL) 10 MG capsule Take 1 capsule (10 mg total) by mouth 3 (three) times daily before meals. 90 capsule 5 Past Week at Unknown time  . FLUoxetine (PROZAC) 40 MG capsule Take 40 mg by mouth daily.   Past Week at Unknown time  . gabapentin (NEURONTIN) 300 MG capsule Take 300 mg by mouth 3 (three) times daily.   Past Week at Unknown time  . HYDROcodone-acetaminophen (NORCO) 10-325 MG tablet Take 1 tablet by mouth every 6 (six) hours as needed for moderate pain.   Past Week at Unknown time  . omeprazole (PRILOSEC) 40 MG capsule Take 1 capsule (40 mg total) by mouth 2 (two) times daily. Before breakfast and supper 60 capsule 3 Past Week at Unknown time  . Oxycodone HCl 10 MG TABS Take 1 tablet (10 mg  total) by mouth 3 (three) times daily as needed (pain). 20 tablet 0 Past Week at Unknown time  . promethazine (PHENERGAN) 25 MG tablet Take 1 tablet by mouth every 4 (four) hours as needed for nausea or vomiting.   0 Past Week at Unknown time  . QUEtiapine (SEROQUEL) 300 MG tablet Take 300 mg by mouth 3 (three) times daily.   Past Week at Unknown time  . SUBOXONE 8-2 MG FILM PLACE ONE FILM UNDER THE TONGUE TWICE DAILY  0 Past Week at Unknown time    Treatment Modalities: Medication Management, Group therapy, Case management,  1 to 1 session with clinician, Psychoeducation, Recreational therapy.  Patient Stressors: Financial difficulties Loss of Uncle died in May 2018-mother died on year ago Marital or family conflict Substance abuse Patient  Strengths: Wellsite geologistCommunication skills General fund of knowledge Motivation for treatment/growth Physical Health  Physician Treatment Plan for Primary Diagnosis: Major depressive disorder, recurrent episode, severe, with psychosis (HCC) Long Term Goal(s): Improvement in symptoms so as ready for discharge Short Term Goals: Ability to verbalize feelings will improve Compliance with prescribed medications will improve  Medication Management: Evaluate patient's response, side effects, and tolerance of medication regimen.  Therapeutic Interventions: 1 to 1 sessions, Unit Group sessions and Medication administration.  Evaluation of Outcomes: Progressing  Physician Treatment Plan for Secondary Diagnosis: Principal Problem:   Major depressive disorder, recurrent episode, severe, with psychosis (HCC) Active Problems:   Sedative, hypnotic or anxiolytic use disorder, mild, abuse   Cocaine use disorder, moderate, dependence (HCC)   Cannabis use disorder, moderate, dependence (HCC)  Long Term Goal(s): Improvement in symptoms so as ready for discharge  Short Term Goals: Ability to verbalize feelings will improve Compliance with prescribed medications will improve  Medication Management: Evaluate patient's response, side effects, and tolerance of medication regimen.  Therapeutic Interventions: 1 to 1 sessions, Unit Group sessions and Medication administration.  Evaluation of Outcomes: Progressing  RN Treatment Plan for Primary Diagnosis: Major depressive disorder, recurrent episode, severe, with psychosis (HCC) Long Term Goal(s): Knowledge of disease and therapeutic regimen to maintain health will improve  Short Term Goals: Ability to identify and develop effective coping behaviors will improve and Compliance with prescribed medications will improve  Medication Management: RN will administer medications as ordered by provider, will assess and evaluate patient's response and provide education to  patient for prescribed medication. RN will report any adverse and/or side effects to prescribing provider.  Therapeutic Interventions: 1 on 1 counseling sessions, Psychoeducation, Medication administration, Evaluate responses to treatment, Monitor vital signs and CBGs as ordered, Perform/monitor CIWA, COWS, AIMS and Fall Risk screenings as ordered, Perform wound care treatments as ordered.  Evaluation of Outcomes: Progressing  LCSW Treatment Plan for Primary Diagnosis: Major depressive disorder, recurrent episode, severe, with psychosis (HCC) Long Term Goal(s): Safe transition to appropriate next level of care at discharge, Engage patient in therapeutic group addressing interpersonal concerns. Short Term Goals: Engage patient in aftercare planning with referrals and resources, Increase emotional regulation, Facilitate patient progression through stages of change regarding substance use diagnoses and concerns, Identify triggers associated with mental health/substance abuse issues and Increase skills for wellness and recovery  Therapeutic Interventions: Assess for all discharge needs, 1 to 1 time with Social worker, Explore available resources and support systems, Assess for adequacy in community support network, Educate family and significant other(s) on suicide prevention, Complete Psychosocial Assessment, Interpersonal group therapy.  Evaluation of Outcomes: Progressing  Progress in Treatment: Attending groups: Intermittently  Participating in groups: Yes, when  he attends Taking medication as prescribed: Yes, MD continues to assess for medication changes as needed Toleration medication: Yes, no side effects reported at this time Family/Significant other contact made: No, pt declined contact Patient understands diagnosis: Developing insight  Discussing patient identified problems/goals with staff: Yes Medical problems stabilized or resolved: Yes Denies suicidal/homicidal ideation:  Yes Issues/concerns per patient self-inventory: None Other: N/A  New problem(s) identified: None identified at this time.   New Short Term/Long Term Goal(s): None identified at this time.   Discharge Plan or Barriers: Pt will return home and follow up outpatient with [facility].  Reason for Continuation of Hospitalization:  Anxiety  Depression Medication stabilization Suicidal ideation Withdrawal symptoms  Estimated Length of Stay: 1-3 days; Estimated discharge date 07/05/17  Attendees: Patient: 07/02/2017 10:18 AM  Physician: Dr. Jama Flavors 07/02/2017 10:18 AM  Nursing: Armando Reichert, RN 07/02/2017 10:18 AM  RN Care Manager: 07/02/2017 10:18 AM  Social Worker: Donnelly Stager, LCSWA 07/02/2017 10:18 AM  Recreational Therapist:  07/02/2017 10:18 AM  Other: Armandina Stammer, NP 07/02/2017 10:18 AM  Other:  07/02/2017 10:18 AM  Other: 07/02/2017 10:18 AM   Scribe for Treatment Team: Jonathon Jordan, MSW,LCSWA 07/02/2017 10:18 AM

## 2017-07-02 NOTE — Plan of Care (Signed)
Problem: Medication: Goal: Compliance with prescribed medication regimen will improve Outcome: Progressing Patient compliant with medications.

## 2017-07-02 NOTE — Progress Notes (Signed)
[facility] MD Progress Note  07/02/2017 6:31 PM [name]  MRN:  [id number]   Subjective: patient reports feeling slightly better than on admission, but remains depressed, dysphoric, and ruminates about psychosocial stressors- in particular, states she spent years taking care of her mother and when she passed away her family members made her leave the house, so that her living situation is now instable. Denies medication side effects. Remains depressed, but denies suicidal ideations.  Objective:  I have discussed case with treatment team and have met with patient . Patient remains depressed, labile, denies suicidal ideations, contracts for safety on unit. Affect intermittently tearful when discussing her stressors, but tends to improve during session. She is more visible on unit today, group participation limited . No disruptive or agitated behaviors on unit. Denies medication side effects- currently on Prozac and Seroquel- states she has been on higher Seroquel doses in the past   Principal Problem: Major depressive disorder, recurrent episode, severe, with psychosis ([place]) Diagnosis:   Patient Active Problem List   Diagnosis Date Noted  . Major depressive disorder, recurrent episode, severe, with psychosis ([place]) [F33.3] 06/30/2017  . Sedative, hypnotic or anxiolytic use disorder, mild, abuse [F13.10] 06/30/2017  . Cocaine use disorder, moderate, dependence ([place]) [F14.20] 06/30/2017  . Cannabis use disorder, moderate, dependence ([place]) [F12.20] 06/30/2017  . IBS [K58.9] 03/28/2010  . ABDOMINAL PAIN [R10.9] 03/28/2010  . GERD [K21.9] 02/21/2010  . HEMATOCHEZIA [K92.1] 02/21/2010  . DYSPHAGIA UNSPECIFIED [R13.10] 02/21/2010  . DIARRHEA [R19.7] 02/21/2010  . BIPOLAR DISORDER UNSPECIFIED [F31.9] 02/15/2010  . DEPRESSION/ANXIETY [F34.1] 02/15/2010  . MIGRAINE HEADACHE [G43.909] 02/15/2010   Total Time spent with patient: 20 minutes  Past Psychiatric History: See H&P  Past Medical  History:  Past Medical History:  Diagnosis Date  . Anxiety disorder   . Asthma   . Chronic pain syndrome   . Cocaine abuse   . Depression   . GERD (gastroesophageal reflux disease)   . Hepatitis    Hep C  . IBS (irritable bowel syndrome)   . Opiate addiction ([place])   . S/P colonoscopy April 2011   rectal polyp, otherwise normal  . S/P endoscopy April 2011   erosive esophagitis, s/p Maloney dilation  . Schatzki's ring   . Schizophrenia [facility])     Past Surgical History:  Procedure Laterality Date  . ESOPHAGEAL DILATION N/A 03/31/2016   Procedure: ESOPHAGEAL DILATION;  Surgeon: Rogene Houston, MD;  Location[facility];  Service: Endoscopy;  Laterality: N/A;  . ESOPHAGOGASTRODUODENOSCOPY (EGD) WITH PROPOFOL N/A 03/31/2016   Procedure: ESOPHAGOGASTRODUODENOSCOPY (EGD) WITH PROPOFOL;  Surgeon: Rogene Houston, MD;  Location: [facility];  Service: Endoscopy;  Laterality: N/A;  12:10  . JOINT REPLACEMENT     x3; last removed x1 year ago.  Marland Kitchen TOTAL ABDOMINAL HYSTERECTOMY     Family History:  Family History  Problem Relation Age of Onset  . Colon cancer Mother        Wyatt Haste   Family Psychiatric  History: See H&P Social History:  History  Alcohol Use  . 7.2 oz/week  . 12 Cans of beer per week     History  Drug Use  . Types: Other-see comments, Cocaine, Marijuana    Comment: xanax    Social History   Social History  . Marital status: Single    Spouse name: N/A  . Number of children: N/A  . Years of education: N/A   Social History Main Topics  . Smoking status: Current  Every Day Smoker    Packs/day: 1.50    Years: 26.00    Types: Cigarettes  . Smokeless tobacco: Never Used     Comment: 1 pack a day since age 56  . Alcohol use 7.2 oz/week    12 Cans of beer per week  . Drug use: Yes    Types: Other-see comments, Cocaine, Marijuana     Comment: xanax  . Sexual activity: No   Other Topics Concern  . None   Social History Narrative  . None    Additional Social History:   Sleep: improving   Appetite:  improving   Current Medications: Current Facility-Administered Medications  Medication Dose Route Frequency Provider Last Rate Last Dose  . alum & mag hydroxide-simeth (MAALOX/MYLANTA) 200-200-20 MG/5ML suspension 30 mL  30 mL Oral Q4H PRN Okonkwo, Justina A, NP   30 mL at 07/02/17 1600  . diclofenac (VOLTAREN) EC tablet 50 mg  50 mg Oral TID Money, Lowry Ram, FNP   50 mg at 07/02/17 1721  . diclofenac sodium (VOLTAREN) 1 % transdermal gel 4 g  4 g Topical TID PRN Lindon Romp A, NP   4 g at 07/01/17 0821  . FLUoxetine (PROZAC) capsule 40 mg  40 mg Oral Daily Okonkwo, Justina A, NP   40 mg at 07/02/17 7106  . gabapentin (NEURONTIN) capsule 300 mg  300 mg Oral TID Lu Duffel, Justina A, NP   300 mg at 07/02/17 1721  . hydrOXYzine (ATARAX/VISTARIL) tablet 50 mg  50 mg Oral TID PRN Money, Lowry Ram, FNP   50 mg at 07/01/17 2118  . magnesium hydroxide (MILK OF MAGNESIA) suspension 30 mL  30 mL Oral Daily PRN Okonkwo, Justina A, NP      . nicotine (NICODERM CQ - dosed in mg/24 hours) patch 21 mg  21 mg Transdermal Q0600 Cobos, Myer Peer, MD   21 mg at 07/02/17 0800  . pantoprazole (PROTONIX) EC tablet 40 mg  40 mg Oral Daily Okonkwo, Justina A, NP   40 mg at 07/02/17 2694  . pneumococcal 23 valent vaccine (PNU-IMMUNE) injection 0.5 mL  0.5 mL Intramuscular Tomorrow-1000 Cobos, Myer Peer, MD   Stopped at 06/30/17 [id number]  . QUEtiapine (SEROQUEL) tablet 400 mg  400 mg Oral QHS Money, Lowry Ram, FNP   400 mg at 07/01/17 2118    Lab Results:  Results for orders placed or performed during the hospital encounter of 06/29/17 (from the past 48 hour(s))  CBC     Status: Abnormal   Collection Time: 06/30/17  6:34 PM  Result Value Ref Range   WBC 9.6 4.0 - 10.5 K/uL   RBC 3.84 (L) 3.87 - 5.11 MIL/uL   Hemoglobin 12.2 12.0 - 15.0 g/dL   HCT 36.8 36.0 - 46.0 %   MCV 95.8 78.0 - 100.0 fL   MCH 31.8 26.0 - 34.0 pg   MCHC 33.2 30.0 - 36.0 g/dL    RDW 12.9 11.5 - 15.5 %   Platelets 243 150 - 400 K/uL    Comment: Performed at [facility], [place] [address], [place], [place] [address]  Basic metabolic panel     Status: None   Collection Time: 06/30/17  6:34 PM  Result Value Ref Range   Sodium 141 135 - 145 mmol/L   Potassium 3.6 3.5 - 5.1 mmol/L   Chloride 110 101 - 111 mmol/L   CO2 25 22 - 32 mmol/L   Glucose, Bld 85 65 - 99 mg/dL  BUN 17 6 - 20 mg/dL   Creatinine, Ser 7.33 0.44 - 1.00 mg/dL   Calcium 8.9 8.9 - 10.7 mg/dL   GFR calc non Af Amer >60 >60 mL/min   GFR calc Af Amer >60 >60 mL/min    Comment: (NOTE) The eGFR has been calculated using the CKD EPI equation. This calculation has not been validated in all clinical situations. eGFR's persistently <60 mL/min signify possible Chronic Kidney Disease.    Anion gap 6 5 - 15    Comment: Performed at [facility], [address] [place], [place] [address]    Blood Alcohol level:  Lab Results  Component Value Date   [facility] <5 06/29/2017   ETH <11 03/02/2013    Metabolic Disorder Labs: No results found for: HGBA1C, MPG No results found for: PROLACTIN No results found for: CHOL, TRIG, HDL, CHOLHDL, VLDL, LDLCALC  Physical Findings: AIMS: Facial and Oral Movements Muscles of Facial Expression: None, normal Lips and Perioral Area: None, normal Jaw: None, normal Tongue: None, normal,Extremity Movements Upper (arms, wrists, hands, fingers): None, normal Lower (legs, knees, ankles, toes): None, normal, Trunk Movements Neck, shoulders, hips: None, normal, Overall Severity Severity of abnormal movements (highest score from questions above): None, normal Incapacitation due to abnormal movements: None, normal Patient's awareness of abnormal movements (rate only patient's report): No Awareness, Dental Status Current problems with teeth and/or dentures?: No Does patient usually wear dentures?: No  CIWA:  CIWA-Ar Total: 0 COWS:      Musculoskeletal: Strength & Muscle Tone: within normal limits Gait & Station: normal Patient leans: N/A  Psychiatric Specialty Exam: Physical Exam  Nursing note and vitals reviewed. Constitutional: She is oriented to person, place, and time. She appears well-developed and well-nourished.  Cardiovascular: Normal rate.   Respiratory: Effort normal.  Musculoskeletal: Normal range of motion.  Neurological: She is alert and oriented to person, place, and time.  Skin: Skin is warm.    Review of Systems  Constitutional: Negative.   HENT: Negative.   Eyes: Negative.   Respiratory: Negative.   Cardiovascular: Negative.   Gastrointestinal: Negative.   Genitourinary: Negative.   Musculoskeletal: Positive for joint pain.  Skin: Negative.   Neurological: Negative.   Endo/Heme/Allergies: Negative.   intermittent nausea   Blood pressure 110/62, pulse 86, temperature 98.6 F (37 C), temperature source Oral, resp. rate 18, height 5\' 5"  (1.651 m), weight 62.1 kg (137 lb), SpO2 100 %.Body mass index is 22.8 kg/m.  General Appearance: Fairly Groomed  Eye Contact:  Good  Speech:  Normal Rate  Volume:  Normal  Mood:  remains depressed, dysphoric  Affect:  constricted, but reactive, smiles at times appropriately   Thought Process:  Linear and Descriptions of Associations: Intact  Orientation:  Other:  fully alert and attentive   Thought Content:  no hallucinations, no delusions expressed   Suicidal Thoughts:  No denies active suicidal plan/intention and contracts for safety on unit at this time, denies homicidal ideations   Homicidal Thoughts:  No  Memory:  recent and remote grossly intact   Judgement:  Fair  Insight:  Fair  Psychomotor Activity:  Normal- no restlessness or agitation  Concentration:  Concentration: Good  Recall:  Good  Fund of Knowledge:  Good  Language:  Good  Akathisia:  No  Handed:  Right  AIMS (if indicated):     Assets:  Communication Skills Desire for  Improvement Social Support  ADL's:  Intact  Cognition:  WNL  Sleep:  Number of Hours:  6   Assessment - patient remains dysphoric ,depressed, ruminative about stressors. She denies suicidal ideations at this time and is future oriented, expressing interest in going to a rehab setting at discharge . She has been diagnosed with Bipolar Disorder in the past, and reports history of responding to Seroquel. Currently tolerating medications well, but she wants to stop Trazodone as she feels it does not help.  Treatment Plan Summary: Treatment plan reviewed as below today 8/13. Continue to encourage group and milieu participation to work on coping skills and symptom reduction Continue to encourage efforts to work on sobriety and relapse prevention -Continue  Seroquel  400 mg PO QHS for mood stability -D/C  Trazodone  -Continue Hydroxyzine 50 mg PO TID PRN for anxiety -Continue Gabapentin 300 mg PO TID for anxiety, pain -Continue Prozac 40 mg PO QDAY for depression and anxiety  Treatment team working on disposition planning options  Jenne Campus, MD 07/02/2017, 6:31 PMPatient ID: Neena Rhymes, female   DOB: 1971/04/20, 46 y.o.   MRN: [id number]

## 2017-07-02 NOTE — Progress Notes (Signed)
Pt attend wrap up group. Her day was a 8  Thru 9. She has been waiting for a visitor to come never came to bring her belongs. Staff advise pt she can drop off and it will be delivered later to room tonight

## 2017-07-02 NOTE — Plan of Care (Signed)
Problem: Safety: Goal: Periods of time without injury will increase Outcome: Completed/Met Date Met: 07/02/17 Patient has not engaged in self harm, denies thoughts to do so. Denies SI.  Problem: Medication: Goal: Compliance with prescribed medication regimen will improve Outcome: Completed/Met Date Met: 07/02/17 Patient is med compliant.

## 2017-07-02 NOTE — Progress Notes (Signed)
D: Patient observed up and visible in the milieu. Frequent contacts made 1:1 throughout shift. Patient verbalizes to this writer her pain is uncontrolled and states it has remained a 9/10 even with scheduled voltaren. Patient's affect angry, irritable with congruent mood.  A: Medicated per orders, prn vistaril and advil given for complaints. (Per chart, patient has allergy to advil however patient states she takes it at home without incident.) Level III obs in place for safety. Emotional support offered. Encouraged completion of Suicide Safety Plan, Self Inventory and programming participation. Discussed POC with MD, SW.    R: Patient verbalizes understanding of POC. Night RN will reassess complaints. Patient denies SI/HI/AVH and remains safe on level III obs.

## 2017-07-02 NOTE — [facility] Group Notes (Signed)
[facility] LCSW Group Therapy  07/02/2017 1:15pm  Type of Therapy: Group Therapy   Topic: Overcoming Obstacles  Participation Level: Pt invited. Did not attend.  Tanaka Gillen B Lamel Mccarley, MSW, LCSWA [phone]    

## 2017-07-03 DIAGNOSIS — F1994 Other psychoactive substance use, unspecified with psychoactive substance-induced mood disorder: Secondary | ICD-10-CM

## 2017-07-03 DIAGNOSIS — K219 Gastro-esophageal reflux disease without esophagitis: Secondary | ICD-10-CM

## 2017-07-03 DIAGNOSIS — Z560 Unemployment, unspecified: Secondary | ICD-10-CM

## 2017-07-03 DIAGNOSIS — K589 Irritable bowel syndrome without diarrhea: Secondary | ICD-10-CM

## 2017-07-03 MED ORDER — QUETIAPINE FUMARATE 300 MG PO TABS
600.0000 mg | ORAL_TABLET | Freq: Every day | ORAL | Status: DC
  Administered 2017-07-03: 600 mg via ORAL
  Filled 2017-07-03 (×4): qty 2

## 2017-07-03 NOTE — [facility] Group Notes (Signed)
[facility] 07/03/2017 1:15pm  Type of Therapy: Mental Health Association Presentation  Participation Level: Active  Participation Quality: Attentive  Affect: Appropriate  Cognitive: Oriented  Insight: Developing/Improving  Engagement in Therapy: Engaged  Modes of Intervention: Discussion, Education and Socialization  Summary of Progress/Problems: Mental Health Association (MHA) Speaker came to talk about his personal journey with living with a mental health diagnosis.The pt processed ways by which to relate to the speaker. MHA speaker provided handouts and educational information pertaining to groups and services offered by the [facility]. Pt was engaged in speaker's presentation and was receptive to resources provided.    Vito BackersLynn B. Beverely PaceBryant, MSW, [facility]CSWA 07/03/2017 4:38 PM

## 2017-07-03 NOTE — [facility] Group Notes (Signed)
Pt attended spiritual care group on grief and loss facilitated by chaplain Phuoc Huy   Group opened with brief discussion and psycho-social ed around grief and loss in relationships and in relation to self - identifying life patterns, circumstances, changes that cause losses. Established group norm of speaking from own life experience. Group goal of establishing open and affirming space for members to share loss and experience with grief, normalize grief experience and provide psycho social education and grief support.     

## 2017-07-03 NOTE — Progress Notes (Signed)
D: Patient seen on dayroom interacting with peer. Patient verbalized no concern. States her day was "ok". Denies pain, SI/HI, AH/VH at this time. Accepted her bedtime meds. No behavior issues noted.   A: Staff offered support and encouragement as needed. Routine safety checks maintained. Will continue to monitor patient.   R: Patient remains safe on unit.

## 2017-07-03 NOTE — Progress Notes (Signed)
Recreation Therapy Notes  Animal-Assisted Activity (AAA) Program Checklist/Progress Notes Patient Eligibility Criteria Checklist & Daily Group note for Rec TxIntervention  Date: 08.14.2018 Time: 2:45pm Location: [address] Dayroom   AAA/T Program Assumption of Risk Form signed by Patient/ or Parent Legal Guardian Yes  Patient is free of allergies or sever asthma Yes  Patient reports no fear of animals Yes  Patient reports no history of cruelty to animals Yes  Patient understands his/her participation is voluntary Yes  Patient washes hands before animal contact Yes  Patient washes hands after animal contact Yes  Behavioral Response: Appropriate   Education:Hand Washing, Appropriate Animal Interaction   Education Outcome: Acknowledges education.   Clinical Observations/Feedback: Patient attended session and interacted appropriately with therapy dog and peers.   Terrie Grajales L Tammatha Cobb, LRT/CTRS        Alany Borman L 07/03/2017 3:01 PM 

## 2017-07-03 NOTE — Plan of Care (Signed)
Problem: Education: Goal: Utilization of techniques to improve thought processes will improve Outcome: Progressing Nurse discussed depression/anxiety/coping skills with patient.    

## 2017-07-03 NOTE — Plan of Care (Signed)
Problem: Activity: Goal: Interest or engagement in activities will improve Outcome: Progressing Patient observed interactive with peers appropriately in the dayroom.

## 2017-07-03 NOTE — Progress Notes (Signed)
  Nursing Progress Note 1900-0730  D) Patient presents with pleasant mood and animated affect. Patient reports "I can't wait to get home to my fur babies". Patient is seen interactive in the milieu. Patient denies  SI/HI/AVH or pain. Patient complains of mild anxiety and requests "everything I can get to knock me out, I think I'm leaving tomorrow". Patient contracts for safety on the unit. Patient reports sleeping well with current regimen.  A) Emotional support given. 1:1 interaction and active listening provided. Patient medicated as prescribed. Medications and plan of care reviewed with patient. Patient verbalized understanding without further questions. Snacks and fluids provided. Opportunities for questions or concerns presented to patient. Patient encouraged to continue to work on treatment goals. Labs, vital signs and patient behavior monitored throughout shift. Patient safety maintained with q15 min safety checks. Low fall risk precautions in place and reviewed with patient; patient verbalized understanding.  R) Patient receptive to interaction with nurse. Patient remains safe on the unit at this time. Patient denies any adverse medication reactions at this time. Patient is resting in bed without complaints. Will continue to monitor.

## 2017-07-03 NOTE — Progress Notes (Signed)
Per pt request [facility] via fax number [phone].   Jonathon JordanLynn B Ladye Macnaughton, MSW, Theresia MajorsLCSWA [phone]

## 2017-07-03 NOTE — Progress Notes (Signed)
D:  Patient's self inventory sheet, patient has poor sleep, no sleep medication given.  Fair appetite, hyper energy level, good concentration.  Rated depression and anxiety 10, denied hopeless.  Denied withdrawals.  Denied SI.  Physical problems, headaches, pain.  Physical pain, stop headaches.    [facility]  No discharge plans at this time. A:  Medications administered per MD orders.  Emotional support and encouragement given patient. R:  Denied SI and HI, contracts for safety.  Denied A/V hallucinations.  Safety maintained with 15 minute checks.

## 2017-07-03 NOTE — Progress Notes (Signed)
[facility] MD Progress Note  07/03/2017 3:45 PM [name]  MRN:  [id number] Subjective:   46 yo Caucasian female, divorced, lives with her brother, unemployed. Background history of SUD and Schizophrenia. Presented to the ER on account of heavy substance use. Has been ruminating on losses she has had over the years. She expressed suicidal thoughts at presentation. Patient wants to get help with her addiction. UDS was positive for cocaine, THC and Benzodiazepines.   Chart reviewed today. Patient discussed at team today.  Staff reports that she has not been able to sleep well at night. She has not voiced any suicidal thoughts. She hopes to get into addiction treatment. She has not been recently observed to be responding to internal stimuli. She has been grooming herself well. She has been interacting appropriately with peers.   Seen today. Says she came in to clear her head. Says she was not really suicidal but was having some bad thoughts. Tells me that she has only attempted suicide once in her life. Says it scared her and she does not ever want to do that. Says other incidents have been accidental while intoxicated. Patient states that she is glad she has been away from drugs. Says she is feeling good again. No negative ruminations. She is able to think clearly again. Says she no longer wants to get into rehab from here. Denies any cravings and states that she would not go to the places that made her use. Denies any violent thoughts. Says she is anti gun because she has a family member that accidentally shot himself. Patient notes that she has not been sleeping well at night. Says she used to be on higher does of Seroquel. No hallucination in any modality lately. No paranoia or persecutory delusion. No passivity of thought. No passivity of will.   Principal Problem: Major depressive disorder, recurrent episode, severe, with psychosis (HCC) Diagnosis:   Patient Active Problem List   Diagnosis Date  Noted  . Major depressive disorder, recurrent episode, severe, with psychosis (HCC) [F33.3] 06/30/2017  . Sedative, hypnotic or anxiolytic use disorder, mild, abuse [F13.10] 06/30/2017  . Cocaine use disorder, moderate, dependence (HCC) [F14.20] 06/30/2017  . Cannabis use disorder, moderate, dependence (HCC) [F12.20] 06/30/2017  . IBS [K58.9] 03/28/2010  . ABDOMINAL PAIN [R10.9] 03/28/2010  . GERD [K21.9] 02/21/2010  . HEMATOCHEZIA [K92.1] 02/21/2010  . DYSPHAGIA UNSPECIFIED [R13.10] 02/21/2010  . DIARRHEA [R19.7] 02/21/2010  . BIPOLAR DISORDER UNSPECIFIED [F31.9] 02/15/2010  . DEPRESSION/ANXIETY [F34.1] 02/15/2010  . MIGRAINE HEADACHE [G43.909] 02/15/2010   Total Time spent with patient: 20 minutes  Past Psychiatric History: As in H&P  Past Medical History:  Past Medical History:  Diagnosis Date  . Anxiety disorder   . Asthma   . Chronic pain syndrome   . Cocaine abuse   . Depression   . GERD (gastroesophageal reflux disease)   . Hepatitis    Hep C  . IBS (irritable bowel syndrome)   . Opiate addiction (HCC)   . S/P colonoscopy April 2011   rectal polyp, otherwise normal  . S/P endoscopy April 2011   erosive esophagitis, s/p Maloney dilation  . Schatzki's ring   . Schizophrenia [facility])     Past Surgical History:  Procedure Laterality Date  . ESOPHAGEAL DILATION N/A 03/31/2016   Procedure: ESOPHAGEAL DILATION;  Surgeon: Malissa Hippo, MD;  [facility];  Service: Endoscopy;  Laterality: N/A;  . ESOPHAGOGASTRODUODENOSCOPY (EGD) WITH PROPOFOL N/A 03/31/2016   Procedure: ESOPHAGOGASTRODUODENOSCOPY (EGD) WITH PROPOFOL;  Surgeon: Malissa HippoNajeeb U Rehman, MD;  Location: [facility];  Service: Endoscopy;  Laterality: N/A;  12:10  . JOINT REPLACEMENT     x3; last removed x1 year ago.  Marland Kitchen. TOTAL ABDOMINAL HYSTERECTOMY     Family History:  Family History  Problem Relation Age of Onset  . Colon cancer Mother        Helmut MusterFrances Scott   Family Psychiatric  History: As in  H&P Social History:  History  Alcohol Use  . 7.2 oz/week  . 12 Cans of beer per week     History  Drug Use  . Types: Other-see comments, Cocaine, Marijuana    Comment: xanax    Social History   Social History  . Marital status: Single    Spouse name: N/A  . Number of children: N/A  . Years of education: N/A   Social History Main Topics  . Smoking status: Current Every Day Smoker    Packs/day: 1.50    Years: 26.00    Types: Cigarettes  . Smokeless tobacco: Never Used     Comment: 1 pack a day since age 46  . Alcohol use 7.2 oz/week    12 Cans of beer per week  . Drug use: Yes    Types: Other-see comments, Cocaine, Marijuana     Comment: xanax  . Sexual activity: No   Other Topics Concern  . None   Social History Narrative  . None   Additional Social History:     Sleep: Poor  Appetite:  Good  Current Medications: Current Facility-Administered Medications  Medication Dose Route Frequency Provider Last Rate Last Dose  . alum & mag hydroxide-simeth (MAALOX/MYLANTA) 200-200-20 MG/5ML suspension 30 mL  30 mL Oral Q4H PRN Okonkwo, Justina A, NP   30 mL at 07/02/17 1600  . diclofenac (VOLTAREN) EC tablet 50 mg  50 mg Oral TID PRN Cobos, Rockey SituFernando A, MD      . diclofenac sodium (VOLTAREN) 1 % transdermal gel 4 g  4 g Topical TID PRN Nira ConnBerry, Jason A, NP   4 g at 07/01/17 0821  . FLUoxetine (PROZAC) capsule 40 mg  40 mg Oral Daily Okonkwo, Justina A, NP   40 mg at 07/03/17 0752  . gabapentin (NEURONTIN) capsule 300 mg  300 mg Oral TID Beryle Lathekonkwo, Justina A, NP   300 mg at 07/03/17 1125  . hydrOXYzine (ATARAX/VISTARIL) tablet 50 mg  50 mg Oral TID PRN Money, Gerlene Burdockravis B, FNP   50 mg at 07/03/17 0755  . ibuprofen (ADVIL,MOTRIN) tablet 400 mg  400 mg Oral Q6H PRN Cobos, Rockey SituFernando A, MD   400 mg at 07/02/17 1923  . magnesium hydroxide (MILK OF MAGNESIA) suspension 30 mL  30 mL Oral Daily PRN Okonkwo, Justina A, NP      . nicotine (NICODERM CQ - dosed in mg/24 hours) patch 21 mg  21  mg Transdermal Q0600 Cobos, Rockey SituFernando A, MD   21 mg at 07/03/17 0735  . pantoprazole (PROTONIX) EC tablet 40 mg  40 mg Oral Daily Okonkwo, Justina A, NP   40 mg at 07/03/17 0753  . pneumococcal 23 valent vaccine (PNU-IMMUNE) injection 0.5 mL  0.5 mL Intramuscular Tomorrow-1000 Cobos, Rockey SituFernando A, MD   Stopped at 06/30/17 [phone]  . QUEtiapine (SEROQUEL) tablet 400 mg  400 mg Oral QHS Money, Gerlene Burdockravis B, FNP   400 mg at 07/02/17 2208    Lab Results: No results found for this or any previous visit (from the past 48 hour(s)).  Blood  Alcohol level:  Lab Results  Component Value Date   ETH <5 06/29/2017   ETH <11 03/02/2013    Metabolic Disorder Labs: No results found for: HGBA1C, MPG No results found for: PROLACTIN No results found for: CHOL, TRIG, HDL, CHOLHDL, VLDL, LDLCALC  Physical Findings: AIMS: Facial and Oral Movements Muscles of Facial Expression: None, normal Lips and Perioral Area: None, normal Jaw: None, normal Tongue: None, normal,Extremity Movements Upper (arms, wrists, hands, fingers): None, normal Lower (legs, knees, ankles, toes): None, normal, Trunk Movements Neck, shoulders, hips: None, normal, Overall Severity Severity of abnormal movements (highest score from questions above): None, normal Incapacitation due to abnormal movements: None, normal Patient's awareness of abnormal movements (rate only patient's report): No Awareness, Dental Status Current problems with teeth and/or dentures?: No Does patient usually wear dentures?: No  CIWA:  CIWA-Ar Total: 1 COWS:  COWS Total Score: 2  Musculoskeletal: Strength & Muscle Tone: within normal limits Gait & Station: normal Patient leans: N/A  Psychiatric Specialty Exam: Physical Exam  Constitutional: No distress.  HENT:  Head: Normocephalic.  Respiratory: Effort normal.  Skin: Skin is warm and dry. She is not diaphoretic.  Psychiatric:  As above    ROS  Blood pressure 121/82, pulse 92, temperature 98.2 F (36.8  C), resp. rate 16, height 5\' 5"  (1.651 m), weight 62.1 kg (137 lb), SpO2 100 %.Body mass index is 22.8 kg/m.  General Appearance: Neatly dressed, calm and cooperative. Good relatedness. Appropriate behavior.   Eye Contact:  Good  Speech:  Clear and Coherent and Normal Rate  Volume:  Normal  Mood:  Euthymic  Affect:  Appropriate and Full Range  Thought Process:  Goal Directed and Linear  Orientation:  Full (Time, Place, and Person)  Thought Content:  No delusional theme. No preoccupation with violent thoughts. No negative ruminations. No obsession.  No hallucination in any modality.   Suicidal Thoughts:  No  Homicidal Thoughts:  No  Memory:  Immediate;   Good Recent;   Good Remote;   Good  Judgement:  Good  Insight:  Partial   Psychomotor Activity:  Normal  Concentration:  Concentration: Good and Attention Span: Good  Recall:  Good  Fund of Knowledge:  Good  Language:  Good  Akathisia:  Negative  Handed:    AIMS (if indicated):     Assets:  Communication Skills Desire for Improvement Housing Physical Health  ADL's:  Intact  Cognition:  WNL  Sleep:  Number of Hours: 6     Assessment and Plan :  Patient has completely come off psychoactive substances. She is no longer having suicidal thoughts. She is not expressing any homicidal or violent thoughts. She is not psychotic or manic. Patient is no longer motivated to get into rehab. Appropriate for discharge tomorrow. We agreed to increase Seroquel to 600 mg HS as she used to be on 900 mg HS.  Psychiatric: Schizophrenia SUD  Medical: GERD IBS  Psychosocial:  Lost custody of her 17 yo son  PLAN: 1. Increase Seroquel to 600 mg HS 2. Continue to monitor mood, behavior and interaction with peers 3. SW would gather collateral tomorrow and facilitate aftercare     Georgiann Cocker, MD 07/03/2017, 3:45 PM

## 2017-07-04 MED ORDER — QUETIAPINE FUMARATE 300 MG PO TABS: 600.0000 mg | ORAL_TABLET | Freq: Every day | ORAL | 0 refills | Status: AC

## 2017-07-04 MED ORDER — DICLOFENAC SODIUM 50 MG PO TBEC: 50.0000 mg | DELAYED_RELEASE_TABLET | Freq: Three times a day (TID) | ORAL | 0 refills | Status: AC | PRN

## 2017-07-04 MED ORDER — GABAPENTIN 300 MG PO CAPS: 300.0000 mg | ORAL_CAPSULE | Freq: Three times a day (TID) | ORAL | 0 refills | Status: AC

## 2017-07-04 MED ORDER — BOOST PO LIQD: 237.0000 mL | Freq: Four times a day (QID) | ORAL | 0 refills | Status: AC

## 2017-07-04 MED ORDER — NICOTINE 21 MG/24HR TD PT24: 21.0000 mg | MEDICATED_PATCH | Freq: Every day | TRANSDERMAL | 0 refills | Status: DC

## 2017-07-04 MED ORDER — FLUOXETINE HCL 40 MG PO CAPS: 40.0000 mg | ORAL_CAPSULE | Freq: Every day | ORAL | 0 refills | Status: AC

## 2017-07-04 MED ORDER — HYDROXYZINE HCL 50 MG PO TABS: 50.0000 mg | ORAL_TABLET | Freq: Three times a day (TID) | ORAL | 0 refills | Status: AC | PRN

## 2017-07-04 MED ORDER — PANTOPRAZOLE SODIUM 40 MG PO TBEC: 40.0000 mg | DELAYED_RELEASE_TABLET | Freq: Every day | ORAL | 0 refills | Status: AC

## 2017-07-04 NOTE — [facility] Suicide Risk Assessment (Signed)
[facility]   Principal Problem: Major depressive disorder, recurrent episode, severe, with psychosis (HCC) Discharge Diagnoses:  Patient Active Problem List   Diagnosis Date Noted  . Major depressive disorder, recurrent episode, severe, with psychosis (HCC) [F33.3] 06/30/2017  . Sedative, hypnotic or anxiolytic use disorder, mild, abuse [F13.10] 06/30/2017  . Cocaine use disorder, moderate, dependence (HCC) [F14.20] 06/30/2017  . Cannabis use disorder, moderate, dependence (HCC) [F12.20] 06/30/2017  . IBS [K58.9] 03/28/2010  . ABDOMINAL PAIN [R10.9] 03/28/2010  . GERD [K21.9] 02/21/2010  . HEMATOCHEZIA [K92.1] 02/21/2010  . DYSPHAGIA UNSPECIFIED [R13.10] 02/21/2010  . DIARRHEA [R19.7] 02/21/2010  . BIPOLAR DISORDER UNSPECIFIED [F31.9] 02/15/2010  . DEPRESSION/ANXIETY [F34.1] 02/15/2010  . MIGRAINE HEADACHE [G43.909] 02/15/2010    Total Time spent with patient: 45 minutes  Musculoskeletal: Strength & Muscle Tone: within normal limits Gait & Station: normal Patient leans: N/A  Psychiatric Specialty Exam: Review of Systems  Constitutional: Negative.   HENT: Negative.   Eyes: Negative.   Respiratory: Negative.   Cardiovascular: Negative.   Gastrointestinal: Negative.   Genitourinary: Negative.   Musculoskeletal: Negative.   Skin: Negative.   Neurological: Negative.   Endo/Heme/Allergies: Negative.   Psychiatric/Behavioral: Negative for depression, hallucinations, memory loss and suicidal ideas. The patient is not nervous/anxious and does not have insomnia.     Blood pressure 98/67, pulse 89, temperature 99.3 F (37.4 C), temperature source Oral, resp. rate 16, height 5\' 5"  (1.651 m), weight 62.1 kg (137 lb), SpO2 100 %.Body mass index is 22.8 kg/m.  General Appearance: Neatly dressed, pleasant, engaging well and cooperative. Appropriate behavior. Not in any distress. Good relatedness. Not internally stimulated  Eye Contact::  Good  Speech:   Spontaneous, normal prosody. Normal tone and rate.   Volume:  Normal  Mood:  Euthymic  Affect:  Appropriate and Full Range  Thought Process:  Goal Directed and Linear  Orientation:  Full (Time, Place, and Person)  Thought Content:  No delusional theme. No preoccupation with violent thoughts. No negative ruminations. No obsession.  No hallucination in any modality.   Suicidal Thoughts:  No  Homicidal Thoughts:  No  Memory:  Immediate;   Good Recent;   Good Remote;   Good  Judgement:  Good  Insight:  Partial as she is not fully invested in rehab at this time.  Psychomotor Activity:  Normal  Concentration:  Good  Recall:  Good  Fund of Knowledge:Good  Language: Good  Akathisia:  Negative  Handed:    AIMS (if indicated):     Assets:  Communication Skills Desire for Improvement Housing Physical Health Social Support  Sleep:  Number of Hours: 6.75  Cognition: WNL  ADL's:  Intact   Clinical Assessment::   46 yo Caucasian female, divorced, lives with her brother, unemployed. Background history of SUD and Schizophrenia. Presented to the ER on account of heavy substance use. Has been ruminating on losses she has had over the years. She expressed suicidal thoughts at presentation. Patient wants to get help with her addiction. UDS was positive for cocaine, THC and Benzodiazepines.   Seen today.  Reports that she is in good spirits. Not feeling depressed. Reports normal energy and interest. Slept well last night. Appetite is back to normal. No craving for substances. Says she would stay away from people that makes her use. She hopes to get into rehab later. Says she is able to think clearly. She is able to focus on task. Her thoughts are not crowded or racing. No evidence of  mania. No hallucination in any modality. She is not making any delusional statement. No passivity of will/thought. She is fully in touch with reality. No thoughts of suicide. No thoughts of homicide. No violent thoughts. No  overwhelming anxiety.  Nursing staff reports that patient has been appropriate on the unit. Patient has been interacting well with peers. No behavioral issues. Patient has not voiced any suicidal thoughts. Patient has not been observed to be internally stimulated. Patient has been adherent with treatment recommendations. Patient has been tolerating their medication well.   Patient was discussed at team. Team members feels that patient is back to her baseline level of function. Team agrees with plan to discharge patient today.   Demographic Factors:  Caucasian and Unemployed  Loss Factors: NA  Historical Factors: Impulsivity  Risk Reduction Factors:   Sense of responsibility to family, Religious beliefs about death, Living with another person, especially a relative, Positive social support, Positive therapeutic relationship and Positive coping skills or problem solving skills  Continued Clinical Symptoms:  As above   Cognitive Features That Contribute To Risk:  None    Suicide Risk:  Minimal: No identifiable suicidal ideation.  Patient is not having any thoughts of suicide at this time. Modifiable risk factors targeted during this admission includes substance use and associated mood disorder. Demographical and historical risk factors cannot be modified. Patient is now engaging well. Patient is reliable and is future oriented. We have buffered patient's support structures. At this point, patient is at low risk of suicide. Patient is aware of the effects of psychoactive substances on decision making process. Patient has been provided with emergency contacts. Patient acknowledges to use resources provided if unforseen circumstances changes their current risk stratification.    Follow-up Information    [facility] Follow up on 07/09/2017.   Why:  Medication managment appointment 8/20 at 12:15 with Dr. Elroy Channel. Therapy appointment same day 8/20 at 12:45 with [facility].   Contact information: [address] [place]  [address] P: [phone] F: [phone]          Plan Of Care/Follow-up recommendations:  1. Continue current psychotropic medications 2. Mental health and addiction follow up as arranged.  3. Provided limited quantity of prescriptions   Georgiann Cocker, MD 07/04/2017, 9:44 AM

## 2017-07-04 NOTE — Progress Notes (Signed)
  [facility] :  Will you be returning to the same living situation after discharge:  Yes,  pt returning home to await for [facility] treatment bed. At discharge, do you have transportation home?: Yes,  taxi voucher provided. Do you have the ability to pay for your medications: Yes,  pt has insurance.  Release of information consent forms completed and in the chart;  Patient's signature needed at discharge.  Patient to Follow up at: Follow-up Information    [facility] Follow up on 07/09/2017.   Why:  Medication managment appointment 8/20 at 12:15 with Dr. Elroy ChannelSpells. [facility] day [facility] 12:[facility]  Contact information: [address] [place]  [id number] P: [phone] F: [phone]       [facility] Follow up.   Specialty:  Addiction Medicine Why:  Social worker has made a referral on your behalf. Please continue to call and check for bed availability. [facility]. Thank you. Contact information: [address] [place] [address] [phone]           Next level of care provider [facility] Link:no  Safety Planning and Suicide Prevention discussed: Yes,  with pt.  Have you used any form of tobacco in the last 30 days? (Cigarettes, Smokeless Tobacco, Cigars, and/or Pipes): Yes  Has patient been referred to the Quitline?: Patient refused referral  Patient has been referred for addiction treatment: Yes  Jonathon JordanLynn B Tyren Dugar, MSW, LCSWA 07/04/2017, 1:23 PM

## 2017-07-04 NOTE — Progress Notes (Signed)
[facility] states that she did not receive CSW's referral yesterday. CSW resent referral via fax number [phone].  Jonathon JordanLynn B Vincen Bejar, MSW, Theresia MajorsLCSWA [phone]

## 2017-07-04 NOTE — Progress Notes (Signed)
Pt d/c from the hospital. All items returns. D/c instructions given, prescriptions given and taxi voucher given. Pt denies si and hi.
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
## 2017-12-01 ENCOUNTER — Emergency Department (HOSPITAL_COMMUNITY): Payer: Medicaid Other

## 2017-12-01 ENCOUNTER — Encounter (HOSPITAL_COMMUNITY): Payer: Self-pay | Admitting: *Deleted

## 2017-12-01 ENCOUNTER — Emergency Department (HOSPITAL_COMMUNITY)
Admission: EM | Admit: 2017-12-01 | Discharge: 2017-12-01 | Disposition: A | Discharge status: Home / Self Care | Payer: Medicaid Other | Attending: Emergency Medicine | Admitting: Emergency Medicine

## 2017-12-01 DIAGNOSIS — J209 Acute bronchitis, unspecified: Secondary | ICD-10-CM | POA: Insufficient documentation

## 2017-12-01 DIAGNOSIS — J45909 Unspecified asthma, uncomplicated: Secondary | ICD-10-CM | POA: Insufficient documentation

## 2017-12-01 DIAGNOSIS — Z79899 Other long term (current) drug therapy: Secondary | ICD-10-CM | POA: Diagnosis not present

## 2017-12-01 DIAGNOSIS — F17210 Nicotine dependence, cigarettes, uncomplicated: Secondary | ICD-10-CM | POA: Diagnosis not present

## 2017-12-01 DIAGNOSIS — R05 Cough: Secondary | ICD-10-CM | POA: Diagnosis present

## 2017-12-01 MED ORDER — ACETAMINOPHEN 325 MG PO TABS
650.0000 mg | ORAL_TABLET | Freq: Once | ORAL | Status: AC
  Administered 2017-12-01: 650 mg via ORAL
  Filled 2017-12-01: qty 2

## 2017-12-01 MED ORDER — DEXAMETHASONE 4 MG PO TABS
8.0000 mg | ORAL_TABLET | Freq: Once | ORAL | Status: AC
  Administered 2017-12-01: 8 mg via ORAL
  Filled 2017-12-01: qty 2

## 2017-12-01 MED ORDER — ALBUTEROL SULFATE HFA 108 (90 BASE) MCG/ACT IN AERS
2.0000 | INHALATION_SPRAY | Freq: Once | RESPIRATORY_TRACT | Status: AC
  Administered 2017-12-01: 2 via RESPIRATORY_TRACT
  Filled 2017-12-01: qty 6.7

## 2017-12-01 NOTE — ED Provider Notes (Signed)
[facility] EMERGENCY DEPARTMENT Provider Note   CSN: [id number] Arrival date & time: 12/01/17  0056     History   Chief Complaint Chief Complaint  Patient presents with  . Cough    HPI [name] is a 47 y.o. female.  The history is provided by the patient.  Cough  This is a new problem. The current episode started more than 2 days ago. The problem occurs hourly. The problem has been gradually worsening. The cough is non-productive. Associated symptoms include chills, sore throat, shortness of breath and wheezing. She is a smoker.  Patient presents for over 4 days of cough, sore throat, feeling feverish. She reports feeling short of breath and wheezing Reports recent illness similar to this, was seen at [facility] and just recently finished a course of antibiotics She reports her symptoms are returning No vomiting or diarrhea is reported [facility] is a smoker  Patient also asked me to look at her right wrist, she reports orthopedist took her cast off and put her in a Velcro splint Reports it still hurts, and she is requesting pain medications. She reports her orthopedist just retired today Past Medical History:  Diagnosis Date  . Anxiety disorder   . Asthma   . Chronic pain syndrome   . Cocaine abuse (HCC)   . Depression   . GERD (gastroesophageal reflux disease)   . Hepatitis    Hep C  . IBS (irritable bowel syndrome)   . Opiate addiction (HCC)   . S/P colonoscopy April 2011   rectal polyp, otherwise normal  . S/P endoscopy April 2011   erosive esophagitis, s/p Maloney dilation  . Schatzki's ring   . Schizophrenia [facility])     Patient Active Problem List   Diagnosis Date Noted  . Major depressive disorder, recurrent episode, severe, with psychosis (HCC) 06/30/2017  . Sedative, hypnotic or anxiolytic use disorder, mild, abuse (HCC) 06/30/2017  . Cocaine use disorder, moderate, dependence (HCC) 06/30/2017  . Cannabis use disorder, moderate, dependence  (HCC) 06/30/2017  . IBS 03/28/2010  . ABDOMINAL PAIN 03/28/2010  . GERD 02/21/2010  . HEMATOCHEZIA 02/21/2010  . DYSPHAGIA UNSPECIFIED 02/21/2010  . DIARRHEA 02/21/2010  . BIPOLAR DISORDER UNSPECIFIED 02/15/2010  . DEPRESSION/ANXIETY 02/15/2010  . MIGRAINE HEADACHE 02/15/2010    Past Surgical History:  Procedure Laterality Date  . ESOPHAGEAL DILATION N/A 03/31/2016   Procedure: ESOPHAGEAL DILATION;  Surgeon: Malissa Hippo, MD;  [facility];  Service: Endoscopy;  Laterality: N/A;  . ESOPHAGOGASTRODUODENOSCOPY (EGD) WITH PROPOFOL N/A 03/31/2016   Procedure: ESOPHAGOGASTRODUODENOSCOPY (EGD) WITH PROPOFOL;  Surgeon: Malissa Hippo, MD;  [facility];  Service: Endoscopy;  Laterality: N/A;  12:10  . JOINT REPLACEMENT     x3; last removed x1 year ago.  Marland Kitchen TOTAL ABDOMINAL HYSTERECTOMY      OB History    No data available       Home Medications    Prior to Admission medications   Medication Sig Start Date End Date Taking? Authorizing Provider  diclofenac (VOLTAREN) 50 MG EC tablet Take 1 tablet (50 mg total) by mouth 3 (three) times daily as needed for moderate pain. 07/04/17   Money, Gerlene Burdock, FNP  FLUoxetine (PROZAC) 40 MG capsule Take 1 capsule (40 mg total) by mouth daily. 07/05/17   Money, Gerlene Burdock, FNP  gabapentin (NEURONTIN) 300 MG capsule Take 1 capsule (300 mg total) by mouth 3 (three) times daily. 07/04/17   Money, Gerlene Burdock, FNP  hydrOXYzine (ATARAX/VISTARIL)  50 MG tablet Take 1 tablet (50 mg total) by mouth 3 (three) times daily as needed for anxiety. 07/04/17   Money, Gerlene Burdock, FNP  lactose free nutrition (BOOST) LIQD Take 237 mLs by mouth 4 (four) times daily. 07/04/17   Money, Gerlene Burdock, FNP  nicotine (NICODERM CQ - DOSED IN MG/24 HOURS) 21 mg/24hr patch Place 1 patch (21 mg total) onto the skin daily at 6 (six) AM. 07/05/17   Money, Gerlene Burdock, FNP  pantoprazole (PROTONIX) 40 MG tablet Take 1 tablet (40 mg total) by mouth daily. 07/05/17   Money, Gerlene Burdock,  FNP  QUEtiapine (SEROQUEL) 300 MG tablet Take 2 tablets (600 mg total) by mouth at bedtime. 07/04/17   Money, Gerlene Burdock, FNP    Family History Family History  Problem Relation Age of Onset  . Colon cancer Mother        [name]    Social History Social History   Tobacco Use  . Smoking status: Current Every Day Smoker    Packs/day: 1.50    Years: 26.00    Pack years: 39.00    Types: Cigarettes  . Smokeless tobacco: Never Used  . Tobacco comment: 1 pack a day since age 33  Substance Use Topics  . Alcohol use: No    Alcohol/week: 7.2 oz    Types: 12 Cans of beer per week    Frequency: Never  . Drug use: Yes    Types: Other-see comments, Marijuana    Comment: xanax     Allergies   Asa [aspirin]; Acetaminophen; Food; Ibuprofen; and Propoxyphene n-acetaminophen   Review of Systems Review of Systems  Constitutional: Positive for chills.  HENT: Positive for sore throat.   Respiratory: Positive for cough, shortness of breath and wheezing.   All other systems reviewed and are negative.    Physical Exam Updated Vital Signs BP 114/83 (BP Location: Right Arm)   Pulse 97   Temp 98.4 F (36.9 C) (Oral)   Resp 18   Ht 1.676 m (5\' 6" )   Wt 63.5 kg (140 lb)   SpO2 100%   BMI 22.60 kg/m   Physical Exam CONSTITUTIONAL: Appears older than stated age, no distress, sleeping when I enter the room HEAD: Normocephalic/atraumatic EYES: EOMI ENMT: Mucous membranes moist, uvula midline, mild erythema, no exudate NECK: supple no meningeal signs SPINE/BACK:entire spine nontender CV: S1/S2 noted, no murmurs/rubs/gallops noted LUNGS: Wheezing bilaterally, no apparent distress ABDOMEN: soft, nontender NEURO: Pt is awake/alert/appropriate, moves all extremitiesx4.  No facial droop.   EXTREMITIES: Velcro splint to right arm SKIN: warm, color normal PSYCH: no abnormalities of mood noted, alert and oriented to situation   ED Treatments / Results  Labs (all labs ordered are  listed, but only abnormal results are displayed) Labs Reviewed - No data to display  EKG  EKG Interpretation None       Radiology Dg Chest 2 View  Result Date: 12/01/2017 CLINICAL DATA:  47 year old female with cough. EXAM: CHEST  2 VIEW COMPARISON:  Chest radiograph dated 11/16/2017 FINDINGS: There is mild diffuse interstitial coarsening and bronchitic changes as seen on the prior radiograph. No focal consolidation, pleural effusion, or pneumothorax. The cardiac silhouette is within normal limits. No acute osseous pathology. IMPRESSION: Diffuse interstitial coarsening and bronchitic changes. No focal consolidation. Electronically Signed   By: Elgie Collard M.D.   On: 12/01/2017 02:01    Procedures Procedures  Medications Ordered in ED Medications  albuterol (PROVENTIL HFA;VENTOLIN HFA) 108 (90 Base) MCG/ACT inhaler 2  puff (2 puffs Inhalation Given 12/01/17 0454)  dexamethasone (DECADRON) tablet 8 mg (8 mg Oral Given 12/01/17 0454)  acetaminophen (TYLENOL) tablet 650 mg (650 mg Oral Given 12/01/17 0455)     Initial Impression / Assessment and Plan / ED Course  I have reviewed the triage vital signs and the nursing notes.  Pertinent  imaging results that were available during my care of the patient were reviewed by me and considered in my medical decision making (see chart for details).     4:36 AM Symptoms for over 4 days, we will not treat for influenza She just finished antibiotics, and her chest x-ray is negative for pneumonia We will treat with albuterol and Decadron  After patient woke up, she began asking for pain medicine and management of her right wrist When I look at previous records, she had a distal radius fracture and orthopedist removed the cast yesterday He felt that it was healing well.  He specifically stated not to give her narcotics Advise she can follow-up as an outpatient for this 5:12 AM She is up walking around, no distress, will discharge  home. Final Clinical Impressions(s) / ED Diagnoses   Final diagnoses:  Acute bronchitis, unspecified organism    ED Discharge Orders    None       Zadie RhineWickline, Ayse Mccartin, MD 12/01/17 [id number]

## 2017-12-01 NOTE — ED Triage Notes (Signed)
Pt reports cough for about 1 week or so, fever 102 tonight, did not take any meds for the same.

## 2018-09-09 ENCOUNTER — Encounter (INDEPENDENT_AMBULATORY_CARE_PROVIDER_SITE_OTHER): Payer: Self-pay | Admitting: Internal Medicine

## 2018-09-09 ENCOUNTER — Ambulatory Visit (INDEPENDENT_AMBULATORY_CARE_PROVIDER_SITE_OTHER): Payer: Medicaid Other | Admitting: Internal Medicine

## 2018-09-09 VITALS — BP 110/70 | HR 80 | Temp 98.1°F | Ht 65.0 in | Wt 181.1 lb

## 2018-09-09 DIAGNOSIS — B1710 Acute hepatitis C without hepatic coma: Secondary | ICD-10-CM | POA: Diagnosis not present

## 2018-09-09 NOTE — Progress Notes (Signed)
Subjective:    Patient ID: [name], female    DOB: 1971/04/29, 47 y.o.   MRN: [id number]  HPI Here today for f/u. Hx of Hepatitis C. She is genotype 1a.  In 2016, she had positive drug screen and was placed on hold.  HCV quaint was positive.  06/24/2015 US abdomen with Elastrography  F3-F4. She desires to be treated for Hepatitis C. She says she is not doing drugs. Has been off drugs for 6 months. Hx of IV drugs in the past. Multiple tattoos. Unemployed. Appetite is good. She has gained about 40 pounds since her last visit. Recently released from prison on drug charges.  BMs are normal. No melena or BRRB. Has a BM every time she eats.    06/29/2017 Rapid drug screen: positive for cocaine, benzos, and tetrahydrocannabinol.     Review of Systems Past Medical History:  Diagnosis Date  . Anxiety disorder   . Asthma   . Chronic pain syndrome   . Cocaine abuse (HCC)   . Depression   . GERD (gastroesophageal reflux disease)   . Hepatitis    Hep C  . IBS (irritable bowel syndrome)   . Opiate addiction (HCC)   . S/P colonoscopy April 2011   rectal polyp, otherwise normal  . S/P endoscopy April 2011   erosive esophagitis, s/p Maloney dilation  . Schatzki's ring   . Schizophrenia [facility])     Past Surgical History:  Procedure Laterality Date  . ESOPHAGEAL DILATION N/A 03/31/2016   Procedure: ESOPHAGEAL DILATION;  Surgeon: Malissa Hippo, MD;  Location: [facility];  Service: Endoscopy;  Laterality: N/A;  . ESOPHAGOGASTRODUODENOSCOPY (EGD) WITH PROPOFOL N/A 03/31/2016   Procedure: ESOPHAGOGASTRODUODENOSCOPY (EGD) WITH PROPOFOL;  Surgeon: Malissa Hippo, MD;  Location: [facility];  Service: Endoscopy;  Laterality: N/A;  12:10  . JOINT REPLACEMENT     x3; last removed x1 year ago.  Marland Kitchen TOTAL ABDOMINAL HYSTERECTOMY      Allergies  Allergen Reactions  . Asa [Aspirin] Anaphylaxis, Hives and Nausea Only  . Acetaminophen Nausea Only  . Food     "spicey food"  . Ibuprofen  Other (See Comments) and Nausea Only    G.I. Upset.   . Propoxyphene N-Acetaminophen Nausea And Vomiting    Current Outpatient Medications on File Prior to Visit  Medication Sig Dispense Refill  . diclofenac (VOLTAREN) 50 MG EC tablet Take 1 tablet (50 mg total) by mouth 3 (three) times daily as needed for moderate pain. 30 tablet 0  . FLUoxetine (PROZAC) 40 MG capsule Take 1 capsule (40 mg total) by mouth daily. 30 capsule 0  . gabapentin (NEURONTIN) 300 MG capsule Take 1 capsule (300 mg total) by mouth 3 (three) times daily. 90 capsule 0  . hydrOXYzine (ATARAX/VISTARIL) 50 MG tablet Take 1 tablet (50 mg total) by mouth 3 (three) times daily as needed for anxiety. 30 tablet 0  . lactose free nutrition (BOOST) LIQD Take 237 mLs by mouth 4 (four) times daily. 120 Can 0  . pantoprazole (PROTONIX) 40 MG tablet Take 1 tablet (40 mg total) by mouth daily. 30 tablet 0  . QUEtiapine (SEROQUEL) 300 MG tablet Take 2 tablets (600 mg total) by mouth at bedtime. 60 tablet 0   No current facility-administered medications on file prior to visit.         Objective:   Physical Exam Blood pressure 110/70, pulse 80, temperature 98.1 F (36.7 C), height 5\' 5"  (1.651 m), weight 181 lb  1.6 oz (82.1 kg). Alert and oriented. Skin warm and dry. Oral mucosa is moist.   . Sclera anicteric, conjunctivae is pink. Thyroid not enlarged. No cervical lymphadenopathy. Lungs clear. Heart regular rate and rhythm.  Abdomen is soft. Bowel sounds are positive. No hepatomegaly. No abdominal masses felt. No tenderness.  No edema to lower extremities.          Assessment & Plan:  Hepatitis C. Will get a Hep C quaint, Hepatic, CBC, HIV,  Acute hepatitis panel.  US abdomen.  Urine drug screen.

## 2018-09-09 NOTE — Patient Instructions (Signed)
Labs and US.  Further recommendations to follow.  

## 2018-09-12 LAB — CBC WITH DIFFERENTIAL/PLATELET
Basophils Absolute: 129 cells/uL (ref 0–200)
Basophils Relative: 1.7 %
EOS PCT: 6.2 %
Eosinophils Absolute: 471 cells/uL (ref 15–500)
HCT: 37.5 % (ref 35.0–45.0)
Hemoglobin: 12.3 g/dL (ref 11.7–15.5)
LYMPHS ABS: 3253 {cells}/uL (ref 850–3900)
MCH: 31.1 pg (ref 27.0–33.0)
MCHC: 32.8 g/dL (ref 32.0–36.0)
MCV: 94.7 fL (ref 80.0–100.0)
MONOS PCT: 11.7 %
MPV: 10.2 fL (ref 7.5–12.5)
NEUTROS ABS: 2858 {cells}/uL (ref 1500–7800)
Neutrophils Relative %: 37.6 %
PLATELETS: 359 10*3/uL (ref 140–400)
RBC: 3.96 10*6/uL (ref 3.80–5.10)
RDW: 12 % (ref 11.0–15.0)
TOTAL LYMPHOCYTE: 42.8 %
WBC: 7.6 10*3/uL (ref 3.8–10.8)
WBCMIX: 889 {cells}/uL (ref 200–950)

## 2018-09-12 LAB — DRUGS OF ABUSE SCREEN W/O ALC, ROUTINE URINE
AMPHETAMINES (1000 NG/ML SCRN): NEGATIVE
BARBITURATES: NEGATIVE
BENZODIAZEPINES: NEGATIVE
COCAINE METABOLITES: NEGATIVE
MARIJUANA MET (50 ng/mL SCRN): NEGATIVE
METHADONE: NEGATIVE
METHAQUALONE: NEGATIVE
OPIATES: NEGATIVE
PHENCYCLIDINE: NEGATIVE
PROPOXYPHENE: NEGATIVE

## 2018-09-12 LAB — HEPATIC FUNCTION PANEL
AG Ratio: 1.7 (calc) (ref 1.0–2.5)
ALBUMIN MSPROF: 4.3 g/dL (ref 3.6–5.1)
ALT: 76 U/L — ABNORMAL HIGH (ref 6–29)
AST: 95 U/L — AB (ref 10–35)
Alkaline phosphatase (APISO): 90 U/L (ref 33–115)
BILIRUBIN DIRECT: 0.1 mg/dL (ref 0.0–0.2)
BILIRUBIN INDIRECT: 0.3 mg/dL (ref 0.2–1.2)
BILIRUBIN TOTAL: 0.4 mg/dL (ref 0.2–1.2)
Globulin: 2.6 g/dL (calc) (ref 1.9–3.7)
Total Protein: 6.9 g/dL (ref 6.1–8.1)

## 2018-09-12 LAB — HEPATITIS PANEL, ACUTE
HEP A IGM: NONREACTIVE
HEP B C IGM: NONREACTIVE
HEP B S AG: NONREACTIVE
HEP C AB: REACTIVE — AB
SIGNAL TO CUT-OFF: 34.8 — ABNORMAL HIGH (ref <1.00)

## 2018-09-12 LAB — HEPATITIS C RNA QUANTITATIVE
HCV Quantitative Log: 6.64 Log IU/mL — ABNORMAL HIGH
HCV RNA, PCR, QN: 4330000 [IU]/mL — AB

## 2018-09-12 LAB — HIV ANTIBODY (ROUTINE TESTING W REFLEX): HIV 1&2 Ab, 4th Generation: NONREACTIVE

## 2018-09-16 ENCOUNTER — Ambulatory Visit (HOSPITAL_COMMUNITY)
Admission: RE | Admit: 2018-09-16 | Discharge: 2018-09-16 | Disposition: A | Discharge status: Home / Self Care | Payer: Medicaid Other | Source: Ambulatory Visit | Attending: Internal Medicine | Admitting: Internal Medicine

## 2018-09-16 DIAGNOSIS — B1710 Acute hepatitis C without hepatic coma: Secondary | ICD-10-CM | POA: Diagnosis not present

## 2018-09-18 ENCOUNTER — Telehealth (INDEPENDENT_AMBULATORY_CARE_PROVIDER_SITE_OTHER): Payer: Self-pay | Admitting: Internal Medicine

## 2018-09-18 NOTE — Telephone Encounter (Signed)
Patient came by office would like you to call her at ph# [phone]

## 2018-09-19 ENCOUNTER — Telehealth (INDEPENDENT_AMBULATORY_CARE_PROVIDER_SITE_OTHER): Payer: Self-pay | Admitting: Internal Medicine

## 2018-09-19 DIAGNOSIS — B1710 Acute hepatitis C without hepatic coma: Secondary | ICD-10-CM

## 2018-09-19 NOTE — Telephone Encounter (Signed)
Urine drug screen ordered and genotype.

## 2018-09-19 NOTE — Telephone Encounter (Signed)
err

## 2020-02-11 IMAGING — US US ABDOMEN COMPLETE
1 series · 14 of 25 positions shown · non-contrast
Comparison: 12/14/2016

CLINICAL DATA: Hepatitis C

EXAM:
ABDOMEN ULTRASOUND COMPLETE

[Series 1: us abdomen complete · 0.25mm/px · 14 of 99 slices shown]
[im 1/99]
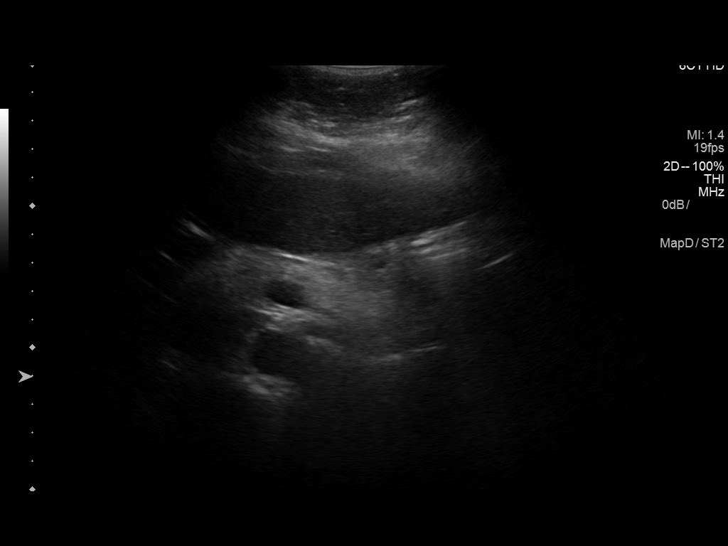
[im 9/99]
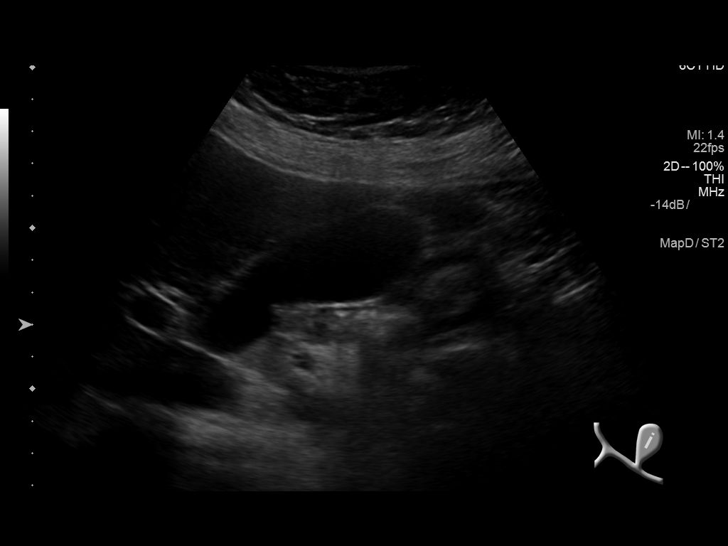
[im 17/99]
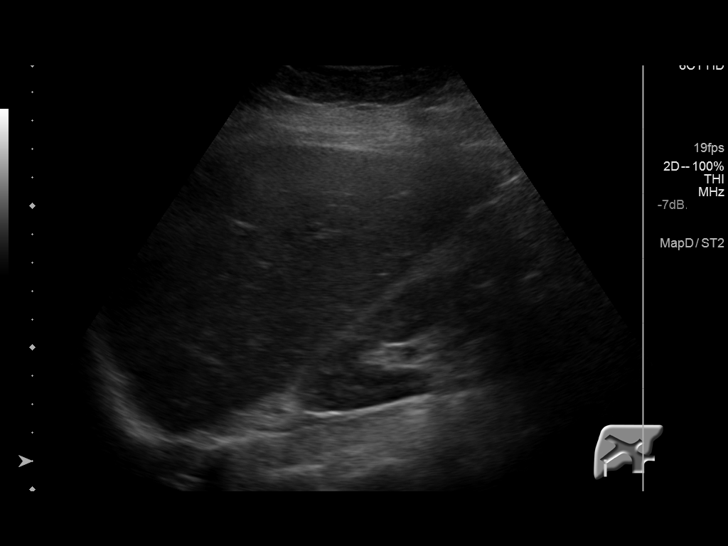
[im 25/99]
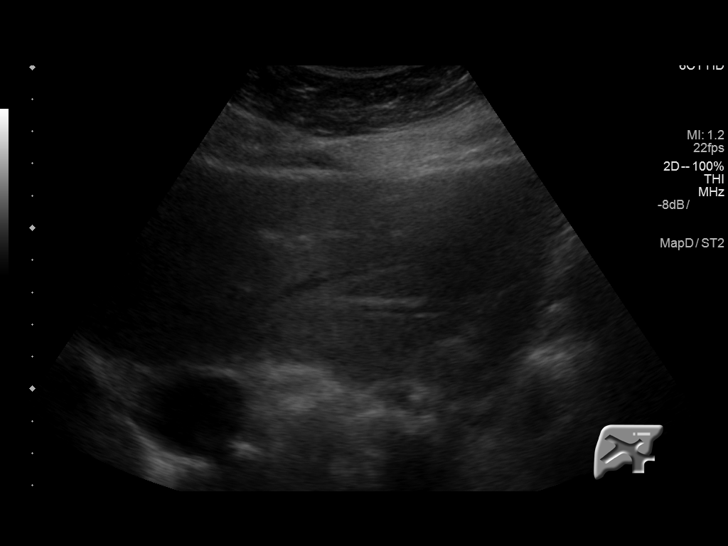
[im 33/99]
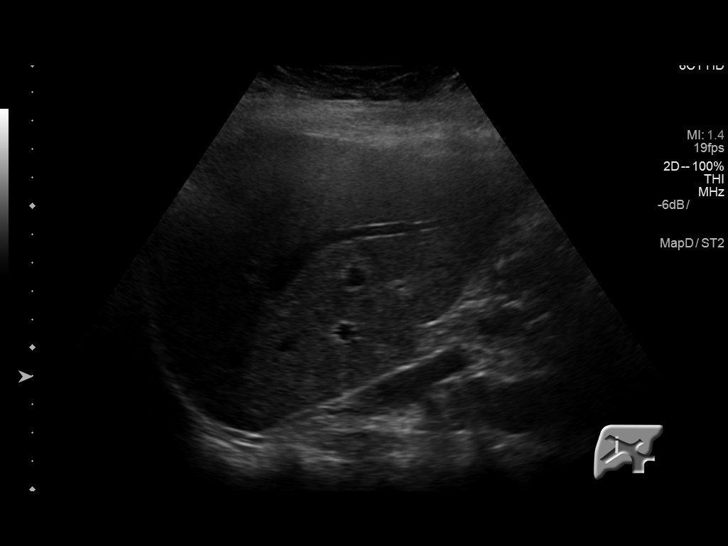
[im 37/99]
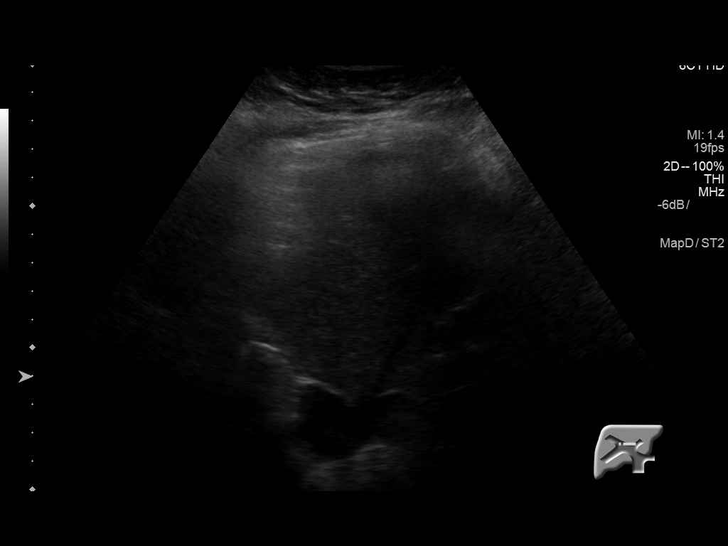
[im 45/99]
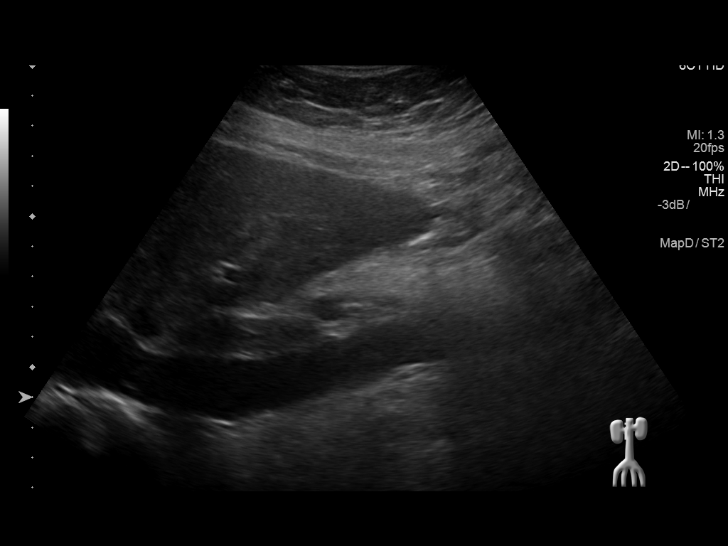
[im 54/99]
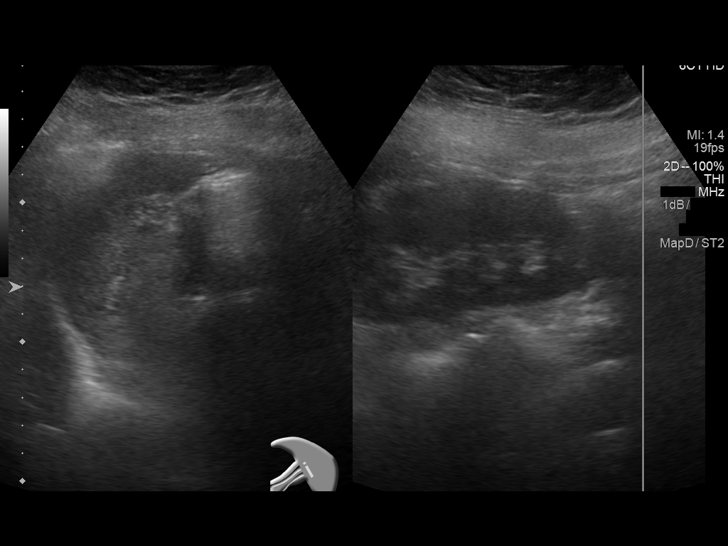
[im 62/99]
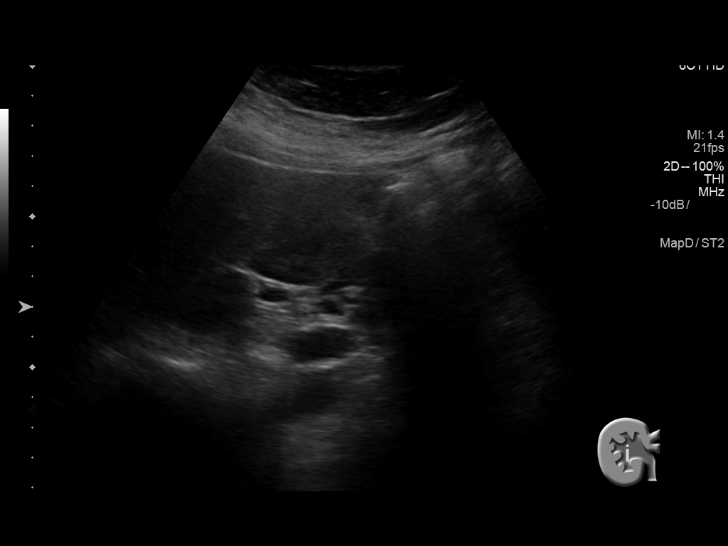
[im 66/99]
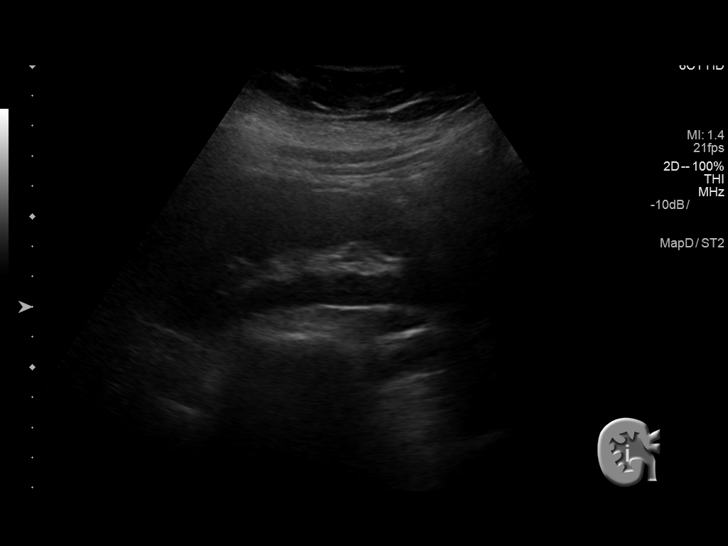
[im 74/99]
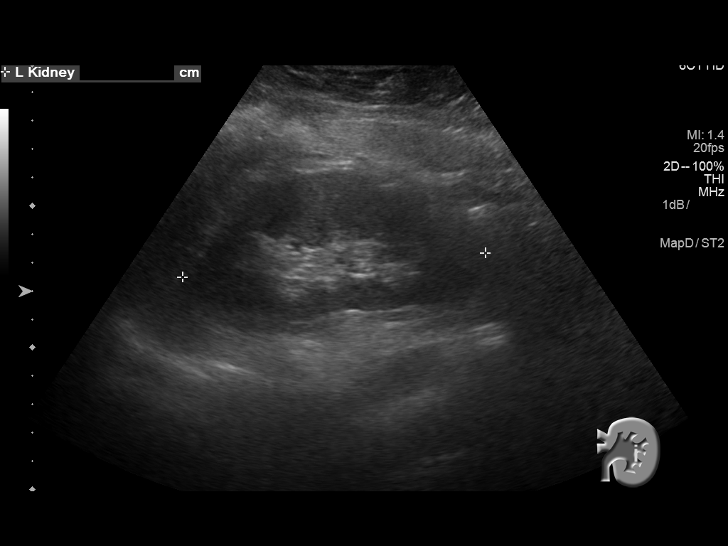
[im 82/99]
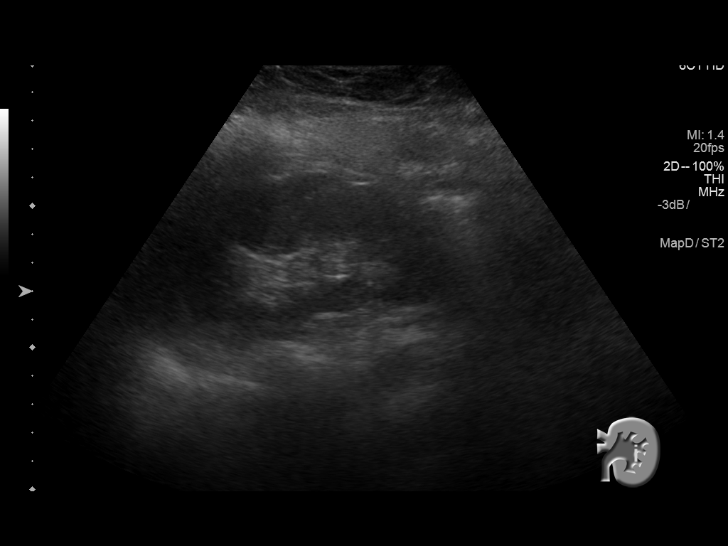
[im 90/99]
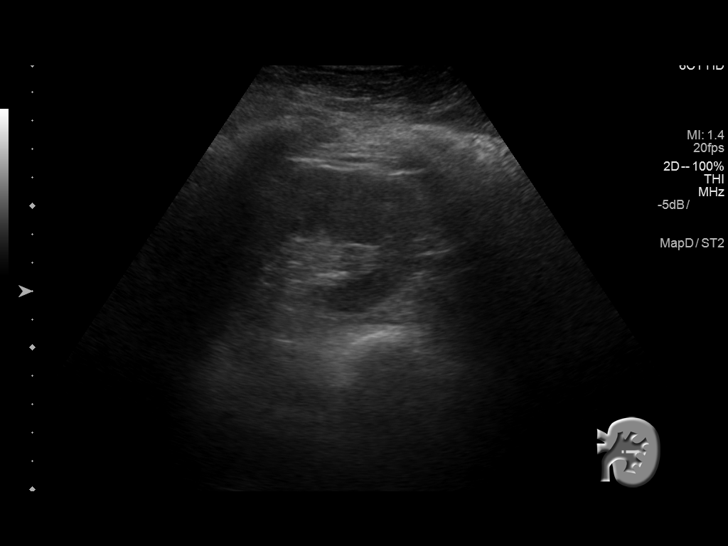
[im 99/99]
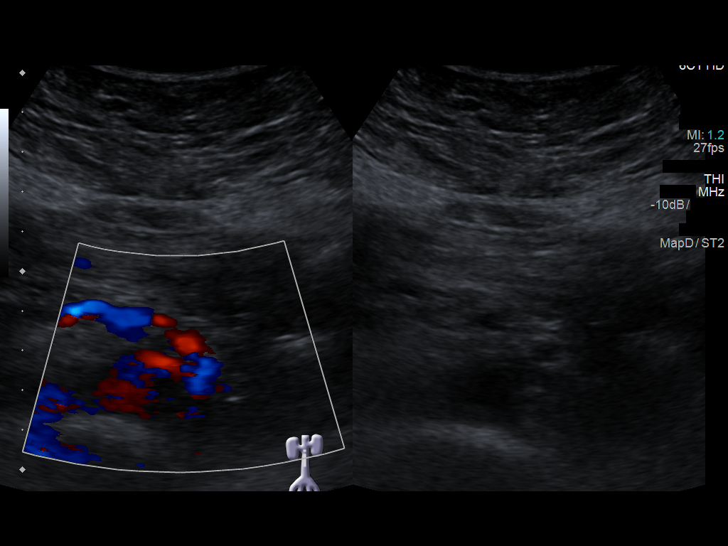

[14 of 25 positions shown; findings below may reference images not displayed]

FINDINGS: Gallbladder: No gallstones or wall thickening visualized. No
sonographic Murphy sign noted by sonographer.

Common bile duct: Diameter: 7 mm

Liver: No focal lesion identified. Within normal limits in
parenchymal echogenicity. Portal vein is patent on color Doppler
imaging with normal direction of blood flow towards the liver.

IVC: No abnormality visualized.

Pancreas: Visualized portion unremarkable.

Spleen: Size and appearance within normal limits.

Right Kidney: Length: 10.9 cm. Echogenicity within normal limits. No
mass or hydronephrosis visualized.

Left Kidney: Length: 10.9 cm. Echogenicity within normal limits. No
mass or hydronephrosis visualized.

Abdominal aorta: No aneurysm visualized.

Other findings: None.
IMPRESSION: No acute abnormality identified.

## 2024-11-28 ENCOUNTER — Emergency Department

## 2024-11-28 ENCOUNTER — Emergency Department
Admission: EM | Admit: 2024-11-28 | Discharge: 2024-11-28 | Disposition: A | Discharge status: Home / Self Care | Attending: Emergency Medicine | Admitting: Emergency Medicine

## 2024-11-28 ENCOUNTER — Other Ambulatory Visit: Payer: Self-pay

## 2024-11-28 DIAGNOSIS — J441 Chronic obstructive pulmonary disease with (acute) exacerbation: Secondary | ICD-10-CM | POA: Insufficient documentation

## 2024-11-28 DIAGNOSIS — J111 Influenza due to unidentified influenza virus with other respiratory manifestations: Secondary | ICD-10-CM

## 2024-11-28 DIAGNOSIS — R0602 Shortness of breath: Secondary | ICD-10-CM | POA: Diagnosis present

## 2024-11-28 DIAGNOSIS — Z87891 Personal history of nicotine dependence: Secondary | ICD-10-CM | POA: Insufficient documentation

## 2024-11-28 LAB — BASIC METABOLIC PANEL WITH GFR
Anion gap: 9 (ref 5–15)
BUN: 13 mg/dL (ref 6–20)
CO2: 27 mmol/L (ref 22–32)
Calcium: 9.7 mg/dL (ref 8.9–10.3)
Chloride: 106 mmol/L (ref 98–111)
Creatinine, Ser: 0.72 mg/dL (ref 0.44–1.00)
GFR, Estimated: >60 mL/min
Glucose, Bld: 107 mg/dL — ABNORMAL HIGH (ref 70–99)
Potassium: 4.1 mmol/L (ref 3.5–5.1)
Sodium: 142 mmol/L (ref 135–145)

## 2024-11-28 LAB — CBC
HCT: 40.1 % (ref 36.0–46.0)
Hemoglobin: 12.5 g/dL (ref 12.0–15.0)
MCH: 30 pg (ref 26.0–34.0)
MCHC: 31.2 g/dL (ref 30.0–36.0)
MCV: 96.4 fL (ref 80.0–100.0)
Platelets: 305 K/uL (ref 150–400)
RBC: 4.16 MIL/uL (ref 3.87–5.11)
RDW: 15 % (ref 11.5–15.5)
WBC: 7.4 K/uL (ref 4.0–10.5)
nRBC: 0 % (ref 0.0–0.2)

## 2024-11-28 MED ORDER — OSELTAMIVIR PHOSPHATE 75 MG PO CAPS: 75.0000 mg | ORAL_CAPSULE | Freq: Two times a day (BID) | ORAL | 0 refills | Status: AC

## 2024-11-28 MED ORDER — IPRATROPIUM-ALBUTEROL 0.5-2.5 (3) MG/3ML IN SOLN: 3.0000 mL | RESPIRATORY_TRACT | 0 refills | Status: AC | PRN

## 2024-11-28 MED ORDER — ALBUTEROL SULFATE HFA 108 (90 BASE) MCG/ACT IN AERS: 2.0000 | INHALATION_SPRAY | Freq: Four times a day (QID) | RESPIRATORY_TRACT | 2 refills | Status: DC | PRN

## 2024-11-28 MED ORDER — IPRATROPIUM-ALBUTEROL 0.5-2.5 (3) MG/3ML IN SOLN: 3.0000 mL | RESPIRATORY_TRACT | 0 refills | Status: DC | PRN

## 2024-11-28 MED ORDER — ALBUTEROL SULFATE HFA 108 (90 BASE) MCG/ACT IN AERS: 2.0000 | INHALATION_SPRAY | Freq: Four times a day (QID) | RESPIRATORY_TRACT | 2 refills | Status: AC | PRN

## 2024-11-28 MED ORDER — IPRATROPIUM-ALBUTEROL 0.5-2.5 (3) MG/3ML IN SOLN
6.0000 mL | Freq: Once | RESPIRATORY_TRACT | Status: AC
  Administered 2024-11-28: 6 mL via RESPIRATORY_TRACT
  Filled 2024-11-28: qty 6

## 2024-11-28 NOTE — Discharge Instructions (Addendum)
 You are seen in the emergency department for COPD exacerbation.  Continue to work on not smoking cigarettes.  You are given a refill for nebulizer treatments.  You were given a nebulizer in the emergency department.  You are also given an albuterol  inhaler.  You can have a prescription for Tamiflu  which can cause nausea, vomiting and diarrhea.  Follow-up closely with your primary care physician.  Return to the emergency department for any worsening symptoms.  Talk to your primary care physician about your Trelegy, uncertain of what dose you are on.  Take your prednisone as prescribed by your primary care provider.  Prednisone -you are given a prescription for a steroid.  It is important that you take this medication with food.  This medication can cause an upset stomach.  It also can increase your glucose if you have a history of diabetes, so it is important that you check your glucose frequently while you are on this medication.

## 2024-11-28 NOTE — ED Triage Notes (Signed)
 Pt presents to ED from home C/O SOB, nausea since yesterday. Audible wheezing in triage. Friend sick with similar. Hx COPD.

## 2024-11-28 NOTE — ED Provider Notes (Signed)
 "  [facility] Provider Note    Event Date/Time   First MD Initiated Contact with Patient 11/28/24 0940     (approximate)   History   Shortness of Breath   HPI  [name] is a 54 y.o. female past medical history significant for COPD with ongoing tobacco use, who presents to the emergency department for not feeling well.  States that everyone that she has been around has been sick with influenza.  States that she started to not feel well last night.  Called her primary care physician which is in [place] and they called her in a steroid treatment but since she had ongoing shortness of breath she came into the emergency department.  States that she also needs a refill for her nebulizer machine.  Denies any significant fever or chills.  No nausea vomiting or diarrhea.  No recent antibiotic use.  No significant chest pain.  No history of DVT or PE.     Physical Exam   Triage Vital Signs: ED Triage Vitals  Encounter Vitals Group     BP 11/28/24 0931 120/77     Girls Systolic BP Percentile --      Girls Diastolic BP Percentile --      Boys Systolic BP Percentile --      Boys Diastolic BP Percentile --      Pulse Rate 11/28/24 0931 (!) 105     Resp 11/28/24 0931 18     Temp 11/28/24 0931 98.9 F (37.2 C)     Temp Source 11/28/24 0931 Oral     SpO2 11/28/24 0931 93 %     Weight 11/28/24 0928 180 lb (81.6 kg)     Height 11/28/24 0928 5' 5 (1.651 m)     Head Circumference --      Peak Flow --      Pain Score 11/28/24 0928 0     Pain Loc --      Pain Education --      Exclude from Growth Chart --     Most recent vital signs: Vitals:   11/28/24 0931  BP: 120/77  Pulse: (!) 105  Resp: 18  Temp: 98.9 F (37.2 C)  SpO2: 93%    Physical Exam Constitutional:      Appearance: She is well-developed.  HENT:     Head: Atraumatic.  Eyes:     Conjunctiva/sclera: Conjunctivae normal.  Cardiovascular:     Rate and Rhythm: Regular rhythm.  Tachycardia present.  Pulmonary:     Effort: No respiratory distress.     Breath sounds: Wheezing and rhonchi present.  Abdominal:     General: There is no distension.  Musculoskeletal:        General: Normal range of motion.     Cervical back: Normal range of motion.     Right lower leg: No edema.     Left lower leg: No edema.  Skin:    General: Skin is warm.     Capillary Refill: Capillary refill takes less than 2 seconds.  Neurological:     General: No focal deficit present.     Mental Status: She is alert. Mental status is at baseline.     IMPRESSION / MDM / ASSESSMENT AND PLAN / ED COURSE  I reviewed the triage vital signs and the nursing notes.  Differential diagnosis including COPD exacerbation, influenza, COVID, RSV, pneumonia, electrolyte abnormality, dehydration  Low suspicion for ACS, no chest pain, low suspicion for pulmonary  embolism, tachycardic but likely in the setting of using albuterol .  Significant wheezing on exam  EKG  I, Clotilda Punter, the attending physician, personally viewed and interpreted this ECG.  EKG showed normal sinus rhythm.  Significant underlying artifact.  No significant ST elevation or depression.  QTc 442.  No significant findings concerning for acute ischemia or dysrhythmia.  RADIOLOGY I independently reviewed imaging, my interpretation of imaging: Chest x-ray with no signs of pneumonia.  Read as no acute findings.  LABS (all labs ordered are listed, but only abnormal results are displayed) Labs interpreted as -    Labs Reviewed  BASIC METABOLIC PANEL WITH GFR - Abnormal; Notable for the following components:      Result Value   Glucose, Bld 107 (*)    All other components within normal limits  CBC     MDM  Patient with no significant leukocytosis.  Creatinine at baseline with no significant electrolyte abnormality.  Clinical picture is most significant for COPD exacerbation likely in the setting of influenza given multiple  exposures.  Patient's primary care physician has already called her in prednisone.  Given DuoNeb treatments in the emergency department and had improvement of her symptoms.  Ambulating with no significant shortness of breath.  Remained stable on room air.  Chest x-ray with no focal findings consistent with pneumonia.  Will start on Tamiflu  given multiple exposures.  Given a prescription for prednisone by her primary care physician.  Given refills for her DuoNeb treatments and discussed return precautions for any ongoing or worsening symptoms.  Discussed smoking cessation.  Patient expressed understanding and has no questions or concerns at time of discharge.     PROCEDURES:  Critical Care performed: No  Procedures  Patient's presentation is most consistent with acute presentation with potential threat to life or bodily function.   MEDICATIONS ORDERED IN ED: Medications  ipratropium-albuterol  (DUONEB) 0.5-2.5 (3) MG/3ML nebulizer solution 6 mL (6 mLs Nebulization Given 11/28/24 1040)    FINAL CLINICAL IMPRESSION(S) / ED DIAGNOSES   Final diagnoses:  COPD exacerbation (HCC)  Influenza-like illness     Rx / DC Orders   ED Discharge Orders          Ordered    ipratropium-albuterol  (DUONEB) 0.5-2.5 (3) MG/3ML SOLN  Every 4 hours PRN,   Status:  Discontinued        11/28/24 1016    albuterol  (VENTOLIN  HFA) 108 (90 Base) MCG/ACT inhaler  Every 6 hours PRN,   Status:  Discontinued        11/28/24 1017    ipratropium-albuterol  (DUONEB) 0.5-2.5 (3) MG/3ML SOLN  Every 4 hours PRN        11/28/24 1021    albuterol  (VENTOLIN  HFA) 108 (90 Base) MCG/ACT inhaler  Every 6 hours PRN        11/28/24 1021    oseltamivir  (TAMIFLU ) 75 MG capsule  2 times daily        11/28/24 1036             Note:  This document was prepared using Dragon voice recognition software and may include unintentional dictation errors.   Punter Clotilda, MD 11/28/24 1128  "

## 2024-11-28 NOTE — ED Notes (Signed)
 See triage note  Presents with some SOB  States she has been around someone that has been sick She developed some nausea with SOB yesterday Unsure of fever  Afebrile on arrival
